# Patient Record
Sex: Male | Born: 1985 | Race: White | Hispanic: No | Marital: Married | Smoking: Never smoker
Health system: Southern US, Community
[De-identification: ages and names within clinical notes are randomized; demographics above are authoritative.]

## PROBLEM LIST (undated history)

## (undated) DIAGNOSIS — F988 Other specified behavioral and emotional disorders with onset usually occurring in childhood and adolescence: Secondary | ICD-10-CM

## (undated) DIAGNOSIS — R569 Unspecified convulsions: Secondary | ICD-10-CM

## (undated) DIAGNOSIS — J189 Pneumonia, unspecified organism: Secondary | ICD-10-CM

## (undated) DIAGNOSIS — I2699 Other pulmonary embolism without acute cor pulmonale: Secondary | ICD-10-CM

## (undated) DIAGNOSIS — J939 Pneumothorax, unspecified: Secondary | ICD-10-CM

## (undated) HISTORY — DX: Other pulmonary embolism without acute cor pulmonale: I26.99

## (undated) HISTORY — DX: Unspecified convulsions: R56.9

## (undated) HISTORY — PX: NO PAST SURGERIES: SHX2092

## (undated) HISTORY — DX: Other specified behavioral and emotional disorders with onset usually occurring in childhood and adolescence: F98.8

---
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
## 2005-01-14 ENCOUNTER — Ambulatory Visit: Payer: Self-pay | Admitting: Pediatrics

## 2005-02-03 ENCOUNTER — Encounter: Payer: Self-pay | Admitting: Family Medicine

## 2006-06-16 ENCOUNTER — Emergency Department (HOSPITAL_COMMUNITY): Admission: EM | Admit: 2006-06-16 | Discharge: 2006-06-16 | Payer: Self-pay | Admitting: Family Medicine

## 2010-02-19 ENCOUNTER — Ambulatory Visit: Payer: Self-pay | Admitting: Family Medicine

## 2010-02-20 LAB — CONVERTED CEMR LAB
Chlamydia, Swab/Urine, PCR: NEGATIVE
HIV: NONREACTIVE

## 2010-05-14 NOTE — Assessment & Plan Note (Signed)
Summary: NEW PT TO [facility]   Vital Signs:  Patient profile:   25 year old male Height:      68.25 inches Weight:      176.25 pounds BMI:     26.70 Temp:     98.5 degrees F oral Pulse rate:   84 / minute Pulse rhythm:   regular BP sitting:   102 / 80  (left arm) Cuff size:   regular  Vitals Entered By: Delilah Shan CMA Duncan Dull) (February 19, 2010 9:48 AM) CC: New Patient to Establish   History of Present Illness: New patient.  Asking about STD testing.  No symptoms.  No h/o stds.  H/o unprotected sex in past, not recently.  d/w patient about protection.  Not in relationship currently.   Preventive Screening-Counseling & Management  Alcohol-Tobacco     Smoking Status: never  Caffeine-Diet-Exercise     Does Patient Exercise: no      Drug Use:  no.    Allergies (verified): No Known Drug Allergies  Past History:  Family History: Last updated: 02/19/2010 Family History of Alcoholism/Addiction, Dad Family History Diabetes 1st degree relative, father, grandmother Family History Hypertension, uncle Family History Lung cancer, grandfather Family History of Cardiovascular disorder, grandmother Family History of Emphysema, father, grandmother Family History of Emotional/Mental Illness, sister (bipolar) M alive, healthy F alive, no contat with patient.  H/o alcoholism, DM2, smoker  Social History: Last updated: 02/19/2010 Occupation: Clinical biochemist Rep, works from home Education:  12, [employer] Single Living at home.  Never Smoked Alcohol use-yes, rare Drug use-no Regular exercise-no Enjoys video games, movies  Past Medical History: SZ at age 23, none since then.  Single episode.   CHICKENPOX, HX OF (ICD-V15.9) H/o oral HSV, rare symptoms  Family History: Family History of Alcoholism/Addiction, Dad Family History Diabetes 1st degree relative, father, grandmother Family History Hypertension, uncle Family History Lung cancer, grandfather Family History of  Cardiovascular disorder, grandmother Family History of Emphysema, father, grandmother Family History of Emotional/Mental Illness, sister (bipolar) M alive, healthy F alive, no contat with patient.  H/o alcoholism, DM2, smoker  Social History: Occupation: Clinical biochemist Rep, works from home Education:  12, [employer] Single Living at home.  Never Smoked Alcohol use-yes, rare Drug use-no Regular exercise-no Enjoys video games, moviesOccupation:  employed Smoking Status:  never Drug Use:  no Does Patient Exercise:  no  Review of Systems       See HPI.  Otherwise negative.    Physical Exam  General:  GEN: nad, alert and oriented HEENT: mucous membranes moist, TM wnl, nasal exam wnl NECK: supple w/o LA CV: rrr.  no murmur PULM: ctab, no inc wob ABD: soft, +bs EXT: no edema SKIN: no acute rash  Genitalia:  Testes bilaterally descended without nodularity, tenderness or masses. No scrotal masses or lesions. No penis lesions or urethral discharge.   Impression & Recommendations:  Problem # 1:  CONTACT/EXPOSURE TO OTHER COMMUNICABLE DISEASES (ICD-V01.89) d/w patient about protection.  Will notify patient at [phone] per his request, cell phone.  follow up as needed.  vaccinate per guidelines.  Prev [facility] at [facility], will request records.  Orders: T-GC Probe, urine [phone]) T-Chlamydia  Probe, urine [phone]) T-HIV Antibody  (Reflex) [phone]) T-RPR (Syphilis) [phone])  Other Orders: Admin 1st Vaccine (40102) Flu Vaccine 61yrs + (72536) Tdap => 63yrs IM (64403) Admin of Any Addtl Vaccine (47425)  Patient Instructions: 1)  We'll contact you with your lab report.  Take care.  Let us know if you  have other concerns.  Glad to see you today.    Orders Added: 1)  New Patient Level II [99202] 2)  T-GC Probe, urine [phone] 3)  T-Chlamydia  Probe, urine [phone] 4)  T-HIV Antibody  (Reflex) [[id number]] 5)  T-RPR (Syphilis) [[id number]] 6)   Admin 1st Vaccine [90471] 7)  Flu Vaccine 40yrs + [28413] 8)  Tdap => 40yrs IM [90715] 9)  Admin of Any Addtl Vaccine [24401]   Immunizations Administered:  Tetanus Vaccine:    Vaccine Type: Tdap    Site: left deltoid    Mfr: GlaxoSmithKline    Dose: 0.5 ml    Route: IM    Given by: Delilah Shan CMA (AAMA)    Exp. Date: 01/30/2012    Lot #: [id number]    VIS given: 02/28/08 version given February 19, 2010.   Immunizations Administered:  Tetanus Vaccine:    Vaccine Type: Tdap    Site: left deltoid    Mfr: GlaxoSmithKline    Dose: 0.5 ml    Route: IM    Given by: Delilah Shan CMA (AAMA)    Exp. Date: 01/30/2012    Lot #: [id number]    VIS given: 02/28/08 version given February 19, 2010.  Prior Medications (reviewed today): None Current Allergies (reviewed today): No known allergies Flu Vaccine Consent Questions     Do you have a history of severe allergic reactions to this vaccine? no    Any prior history of allergic reactions to egg and/or gelatin? no    Do you have a sensitivity to the preservative Thimersol? no    Do you have a past history of Guillan-Barre Syndrome? no    Do you currently have an acute febrile illness? no    Have you ever had a severe reaction to latex? no    Vaccine information given and explained to patient? yes    Are you currently pregnant? no    Lot Number:[id number]   Exp Date:10/10/2010   Site Given  Left Deltoid IMies Lugene Fuquay CMA (AAMA)  February 19, 2010 10:34 AM            .lbflu  Appended Document: NEW PT TO [facility]

## 2010-05-14 NOTE — Letter (Signed)
Summary: Lajean Manes MD  Lajean Manes MD   Imported By: Lester Glencoe 03/19/2010 11:12:56  _____________________________________________________________________  External Attachment:    Type:   Image     Comment:   External Document

## 2014-05-31 ENCOUNTER — Ambulatory Visit (INDEPENDENT_AMBULATORY_CARE_PROVIDER_SITE_OTHER): Payer: 59 | Admitting: Internal Medicine

## 2014-05-31 ENCOUNTER — Encounter: Payer: Self-pay | Admitting: Internal Medicine

## 2014-05-31 VITALS — BP 127/80 | HR 113 | Temp 98.1°F | Resp 18 | Wt 228.8 lb

## 2014-05-31 DIAGNOSIS — F419 Anxiety disorder, unspecified: Secondary | ICD-10-CM

## 2014-05-31 DIAGNOSIS — F988 Other specified behavioral and emotional disorders with onset usually occurring in childhood and adolescence: Secondary | ICD-10-CM | POA: Insufficient documentation

## 2014-05-31 DIAGNOSIS — R197 Diarrhea, unspecified: Secondary | ICD-10-CM

## 2014-05-31 DIAGNOSIS — Z114 Encounter for screening for human immunodeficiency virus [HIV]: Secondary | ICD-10-CM

## 2014-05-31 DIAGNOSIS — R350 Frequency of micturition: Secondary | ICD-10-CM

## 2014-05-31 LAB — COMPREHENSIVE METABOLIC PANEL
ALBUMIN: 4.2 g/dL (ref 3.5–5.2)
ALK PHOS: 83 U/L (ref 39–117)
ALT: 27 U/L (ref 0–53)
AST: 21 U/L (ref 0–37)
BILIRUBIN TOTAL: 0.4 mg/dL (ref 0.2–1.2)
BUN: 20 mg/dL (ref 6–23)
CO2: 24 mEq/L (ref 19–32)
Calcium: 9.2 mg/dL (ref 8.4–10.5)
Chloride: 106 mEq/L (ref 96–112)
Creat: 0.93 mg/dL (ref 0.50–1.35)
Glucose, Bld: 71 mg/dL (ref 70–99)
POTASSIUM: 4.6 meq/L (ref 3.5–5.3)
SODIUM: 140 meq/L (ref 135–145)
TOTAL PROTEIN: 7.1 g/dL (ref 6.0–8.3)

## 2014-05-31 NOTE — Progress Notes (Signed)
Subjective:    Patient ID: Billy Galloway, male    DOB: 07/14/1985, 29 y.o.   MRN: [id number]  DOS:  05/31/2014 Type of visit - description : New patient, several concerns  Interval history: Likes a  screening for HIV and diabetes Also, few months history of diarrhea, states his stools are loose but not watery, he has several BMs daily (previously had one usually). No change in his diet. No blood in the stools, no nausea or vomiting.  He is also urinating more than usual but denies excessive thirst or increase in appetite. Has noticed some urinary frequency but no blood in the urine or difficulty urinating. No weight loss.  Also, for long time he knew he has introverted personality, he usually gets anxious when he is in crowded places or in front of people, symptoms are slightly getting worse.    Review of Systems See history of present illness Also denies fever or chills. No chest pain or lower extremity edema No cough, shortness of breath or sputum production No dizziness, syncope, headaches. No runny nose, sore throat, eye discharge or redness  Past Medical History  Diagnosis Date  . Chicken pox   . Seizures     as a baby  . ADD (attention deficit disorder)     Past Surgical History  Procedure Laterality Date  . No past surgeries      History   Social History  . Marital Status: Single    Spouse Name: N/A  . Number of Children: 0  . Years of Education: N/A   Occupational History  . some college, works for [employer] (from home)    Social History Main Topics  . Smoking status: Never Smoker   . Smokeless tobacco: Never Used  . Alcohol Use: No     Comment: rare   . Drug Use: No  . Sexual Activity: Not on file   Other Topics Concern  . Not on file   Social History Narrative   Lives by himself     Family History  Problem Relation Age of Onset  . Alcohol abuse Father   . Hypertension Father   . Diabetes Father   . Alcohol abuse Paternal Grandfather     . Hypertension Paternal Grandfather   . Diabetes Paternal Grandfather   . CAD Other     GM?  Marland Kitchen. Colon cancer Neg Hx   . Prostate cancer Neg Hx       Medication List       This list is accurate as of: 05/31/14 11:59 PM.  Always use your most recent med list.               amphetamine-dextroamphetamine 20 MG tablet  Commonly known as:  ADDERALL  Take 1 tablet by mouth daily.           Objective:   Physical Exam BP 127/80 mmHg  Pulse 113  Temp(Src) 98.1 F (36.7 C) (Oral)  Resp 18  Wt 228 lb 12.8 oz (103.783 kg)  SpO2 97%  General:   Well developed, well nourished . NAD.  Neck:  Full range of motion. Supple. No  Thyromegaly . HEENT:  Normocephalic . Face symmetric, atraumatic Lungs:  CTA B Normal respiratory effort, no intercostal retractions, no accessory muscle use. Heart: Regular heart rate, slightly tachycardic,  no murmur.  Abdomen:  Not distended, soft, non-tender. No rebound or rigidity. No mass,organomegaly Muscle skeletal: no pretibial edema bilaterally  Skin: Exposed areas without rash. Not pale.  Not jaundice Neurologic:  alert & oriented X3.  Speech normal, gait appropriate for age and unassisted Strength symmetric and appropriate for age. Minimal finger tremor when he extends his hands ?  Psych: Cognition and judgment appear intact.  Cooperative with normal attention span and concentration.  Behavior appropriate. No anxious or depressed appearing.        Assessment & Plan:    Screening for HIV Screening for diabetes   Urinary frequency, we are checking his blood sugar, will also check a UA

## 2014-05-31 NOTE — Progress Notes (Signed)
Pre visit review using our clinic review tool, if applicable. No additional management support is needed unless otherwise documented below in the visit note. 

## 2014-05-31 NOTE — Patient Instructions (Signed)
Get your blood work before you leave   Start taking a probiotic such as Align 1 daily for one month. If the diarrhea is not gradually improving over the next 3 weeks , please let me know.  Come back in 3 months  for a   physical exam   Come back fasting

## 2014-06-01 LAB — URINALYSIS, ROUTINE W REFLEX MICROSCOPIC
Bilirubin Urine: NEGATIVE
Glucose, UA: NEGATIVE mg/dL
HGB URINE DIPSTICK: NEGATIVE
LEUKOCYTES UA: NEGATIVE
NITRITE: NEGATIVE
PROTEIN: NEGATIVE mg/dL
Specific Gravity, Urine: >1.03 — ABNORMAL HIGH (ref 1.005–1.030)
UROBILINOGEN UA: 0.2 mg/dL (ref 0.0–1.0)
pH: 6.5 (ref 5.0–8.0)

## 2014-06-01 LAB — CBC WITH DIFFERENTIAL/PLATELET
BASOS PCT: 1 % (ref 0–1)
Basophils Absolute: 0.1 10*3/uL (ref 0.0–0.1)
EOS ABS: 0.1 10*3/uL (ref 0.0–0.7)
Eosinophils Relative: 1 % (ref 0–5)
HCT: 46 % (ref 39.0–52.0)
HEMOGLOBIN: 15.9 g/dL (ref 13.0–17.0)
LYMPHS ABS: 1.9 10*3/uL (ref 0.7–4.0)
Lymphocytes Relative: 16 % (ref 12–46)
MCH: 29.4 pg (ref 26.0–34.0)
MCHC: 34.6 g/dL (ref 30.0–36.0)
MCV: 85.2 fL (ref 78.0–100.0)
MPV: 10.2 fL (ref 8.6–12.4)
Monocytes Absolute: 1.5 10*3/uL — ABNORMAL HIGH (ref 0.1–1.0)
Monocytes Relative: 13 % — ABNORMAL HIGH (ref 3–12)
NEUTROS ABS: 8 10*3/uL — AB (ref 1.7–7.7)
NEUTROS PCT: 69 % (ref 43–77)
Platelets: 341 10*3/uL (ref 150–400)
RBC: 5.4 MIL/uL (ref 4.22–5.81)
RDW: 13.7 % (ref 11.5–15.5)
WBC: 11.6 10*3/uL — AB (ref 4.0–10.5)

## 2014-06-01 LAB — HIV ANTIBODY (ROUTINE TESTING W REFLEX): HIV 1&2 Ab, 4th Generation: NONREACTIVE

## 2014-06-01 LAB — TSH: TSH: 1.023 u[IU]/mL (ref 0.350–4.500)

## 2014-06-02 NOTE — Assessment & Plan Note (Signed)
Diarrhea, Loose stools and multiple BMs for few months. Not much evidence of a infectious process. He is a slightly tachycardic and has mild tremor, thyroid disease? Plan:  Trial of probiotics, CBC, CMP and TSH. If not better he will let me know.

## 2014-06-02 NOTE — Assessment & Plan Note (Signed)
Sx c/w social anxiety per description, he is a established patients with psychiatry Dr. Evelene CroonKaur, recommend to discuss with them

## 2014-08-13 ENCOUNTER — Telehealth: Payer: Self-pay | Admitting: Internal Medicine

## 2014-08-13 NOTE — Telephone Encounter (Signed)
Pre visit letter sent  °

## 2014-09-02 ENCOUNTER — Telehealth: Payer: Self-pay | Admitting: *Deleted

## 2014-09-02 NOTE — Telephone Encounter (Signed)
Unable to reach patient at time of Pre-Visit Call.  Left message for patient to return call when available.    

## 2014-09-03 ENCOUNTER — Encounter: Payer: Self-pay | Admitting: Internal Medicine

## 2014-09-03 ENCOUNTER — Ambulatory Visit (INDEPENDENT_AMBULATORY_CARE_PROVIDER_SITE_OTHER): Payer: 59 | Admitting: Internal Medicine

## 2014-09-03 ENCOUNTER — Other Ambulatory Visit: Payer: Self-pay

## 2014-09-03 VITALS — BP 126/72 | HR 109 | Temp 98.6°F | Ht 69.0 in | Wt 234.2 lb

## 2014-09-03 DIAGNOSIS — Z0000 Encounter for general adult medical examination without abnormal findings: Secondary | ICD-10-CM | POA: Insufficient documentation

## 2014-09-03 DIAGNOSIS — R197 Diarrhea, unspecified: Secondary | ICD-10-CM

## 2014-09-03 NOTE — Progress Notes (Signed)
Subjective:    Patient ID: Billy Galloway, male    DOB: 1985-06-07, 29 y.o.   MRN: [id number]  DOS:  09/03/2014 Type of visit - description : cpx Interval history: No new concerns, feeling well   Review of Systems Constitutional: No fever. No chills. No unexplained wt changes. No unusual sweats  HEENT: No dental problems, no ear discharge, no facial swelling, no voice changes. No eye discharge, no eye  redness , no  intolerance to light   Respiratory: No wheezing , no  difficulty breathing. No cough , no mucus production  Cardiovascular: No CP, no leg swelling , no  Palpitations  GI: no nausea, no vomiting,   no  abdominal pain. Continue with diarrhea, approximately 2 or 3 loose bowel movements daily. No blood in the stools. No dysphagia, no odynophagia    Endocrine: No polyphagia, no polyuria , no polydipsia  GU: No dysuria, gross hematuria, difficulty urinating. No urinary urgency, continue with urinary frequency without other urinary sx  Musculoskeletal: No joint swellings or unusual aches or pains  Skin: No change in the color of the skin, palor , no  Rash  Allergic, immunologic: No environmental allergies , no  food allergies  Neurological: No dizziness no  syncope. No headaches. No diplopia, no slurred, no slurred speech, no motor deficits, no facial  Numbness  Hematological: No enlarged lymph nodes, no easy bruising , no unusual bleedings  Psychiatry: No suicidal ideas, no hallucinations, no beavior problems, no confusion.  Anxiety, on Xanax per Dr. Evelene Croon, no depression    Past Medical History  Diagnosis Date  . Chicken pox   . Seizures     as a baby  . ADD (attention deficit disorder)     Past Surgical History  Procedure Laterality Date  . No past surgeries      History   Social History  . Marital Status: Single    Spouse Name: N/A  . Number of Children: 0  . Years of Education: N/A   Occupational History  . some college, works for [employer] (from  home)    Social History Main Topics  . Smoking status: Never Smoker   . Smokeless tobacco: Never Used  . Alcohol Use: No     Comment: rare   . Drug Use: No  . Sexual Activity: Not on file   Other Topics Concern  . Not on file   Social History Narrative   Lives by himself     Family History  Problem Relation Age of Onset  . Alcohol abuse Father   . Hypertension Father   . Diabetes Father   . Alcohol abuse Paternal Grandfather   . Hypertension Paternal Grandfather   . Diabetes Paternal Grandfather   . CAD Other     GM?  Marland Kitchen Colon cancer Neg Hx   . Prostate cancer Neg Hx       Medication List       This list is accurate as of: 09/03/14 11:59 PM.  Always use your most recent med list.               ALPRAZolam 0.5 MG tablet  Commonly known as:  XANAX  Take 1 tablet by mouth daily as needed.     amphetamine-dextroamphetamine 20 MG tablet  Commonly known as:  ADDERALL  Take 1 tablet by mouth daily.           Objective:   Physical Exam BP 126/72 mmHg  Pulse 109  Temp(Src)  98.6 F (37 C) (Oral)  Ht 5\' 9"  (1.753 m)  Wt 234 lb 4 oz (106.255 kg)  BMI 34.58 kg/m2  SpO2 97% General:   Well developed, well nourished . NAD.  HEENT:  Normocephalic . Face symmetric, atraumatic Neck: No thyromegaly Lungs:  CTA B Normal respiratory effort, no intercostal retractions, no accessory muscle use. Heart: RRR,  no murmur.  no pretibial edema bilaterally  Abdomen:  Not distended, soft, non-tender. No rebound or rigidity. No mass,organomegaly Skin: Not pale. Not jaundice Neurologic:  alert & oriented X3.  Speech normal, gait appropriate for age and unassisted Psych--  Cognition and judgment appear intact.  Cooperative with normal attention span and concentration.  Behavior appropriate. slt  anxious but no depressed appearing.       Assessment & Plan:

## 2014-09-03 NOTE — Progress Notes (Signed)
Pre visit review using our clinic review tool, if applicable. No additional management support is needed unless otherwise documented below in the visit note. 

## 2014-09-03 NOTE — Assessment & Plan Note (Signed)
Td 2011 Extensive discussion about diet, exercise, the need to lose weight. Recent labs reviewed, will check a cholesterol panel and CBC, last WBC was slightly elevated.  Other issues: Diarrhea, loose stools: This is going on for years, recent labs negative. We agreed to refer to GI. Further eval? IBS? Cscope? Mild tachycardia, recommend to discuss with psychiatry, related to Adderall? Mild urinary frequency for few months, no other urinary symptoms, recent UA negative. Recommend observation.

## 2014-09-03 NOTE — Patient Instructions (Signed)
Please go to the lab

## 2014-09-04 LAB — CBC WITH DIFFERENTIAL/PLATELET
BASOS ABS: 0.1 10*3/uL (ref 0.0–0.1)
Basophils Relative: 1.4 % (ref 0.0–3.0)
Eosinophils Absolute: 0.2 10*3/uL (ref 0.0–0.7)
Eosinophils Relative: 2.5 % (ref 0.0–5.0)
HEMATOCRIT: 44.5 % (ref 39.0–52.0)
Hemoglobin: 15.4 g/dL (ref 13.0–17.0)
LYMPHS ABS: 2 10*3/uL (ref 0.7–4.0)
Lymphocytes Relative: 21 % (ref 12.0–46.0)
MCHC: 34.6 g/dL (ref 30.0–36.0)
MCV: 84.5 fl (ref 78.0–100.0)
Monocytes Absolute: 1 10*3/uL (ref 0.1–1.0)
Monocytes Relative: 10.1 % (ref 3.0–12.0)
NEUTROS ABS: 6.3 10*3/uL (ref 1.4–7.7)
NEUTROS PCT: 65 % (ref 43.0–77.0)
PLATELETS: 281 10*3/uL (ref 150.0–400.0)
RBC: 5.26 Mil/uL (ref 4.22–5.81)
RDW: 13.6 % (ref 11.5–15.5)
WBC: 9.7 10*3/uL (ref 4.0–10.5)

## 2014-09-04 LAB — LIPID PANEL
CHOL/HDL RATIO: 3
Cholesterol: 162 mg/dL (ref 0–200)
HDL: 47.7 mg/dL (ref >39.00)
NONHDL: 114.3
Triglycerides: 252 mg/dL — ABNORMAL HIGH (ref 0.0–149.0)
VLDL: 50.4 mg/dL — ABNORMAL HIGH (ref 0.0–40.0)

## 2014-09-04 LAB — LDL CHOLESTEROL, DIRECT: Direct LDL: 96 mg/dL

## 2014-09-06 ENCOUNTER — Encounter: Payer: Self-pay | Admitting: Gastroenterology

## 2014-09-06 ENCOUNTER — Ambulatory Visit (INDEPENDENT_AMBULATORY_CARE_PROVIDER_SITE_OTHER): Payer: 59 | Admitting: Gastroenterology

## 2014-09-06 ENCOUNTER — Other Ambulatory Visit (INDEPENDENT_AMBULATORY_CARE_PROVIDER_SITE_OTHER): Payer: 59

## 2014-09-06 VITALS — BP 116/78 | HR 80 | Ht 68.25 in | Wt 233.2 lb

## 2014-09-06 DIAGNOSIS — R197 Diarrhea, unspecified: Secondary | ICD-10-CM

## 2014-09-06 LAB — SEDIMENTATION RATE: Sed Rate: 12 mm/hr (ref 0–22)

## 2014-09-06 NOTE — Progress Notes (Signed)
HPI: This is a  very pleasant 29 year old man    who was referred to me by Wanda Plump, MD  to evaluate  chronic loose stools .    Chief complaint is chronic diarrhea  Loose stools, diarrhea for 9-12 months.  Will go at least 2-3 times.+ nocturnal occasional.  Never bloody.  Overall weight up recently.  No abd cramping.  + pyrosis at times.  No med changes recently.  Drinks 2-3 sodas daily.  Rarely etoh only.  No abx that he can recall.  No diarrhea, colitis in family.   Labs 06/2014; HIV neg, TSH normal, CBC essentially normal, CMET normal.  Review of systems: Pertinent positive and negative review of systems were noted in the above HPI section. Complete review of systems was performed and was otherwise normal.   Past Medical History  Diagnosis Date  . Chicken pox   . Seizures     as a baby  . ADD (attention deficit disorder)     Past Surgical History  Procedure Laterality Date  . No past surgeries      Current Outpatient Prescriptions  Medication Sig Dispense Refill  . ALPRAZolam (XANAX) 0.5 MG tablet Take 1 tablet by mouth daily as needed.    Marland Kitchen amphetamine-dextroamphetamine (ADDERALL) 20 MG tablet Take 1 tablet by mouth daily.  0   No current facility-administered medications for this visit.    Allergies as of 09/06/2014  . (No Known Allergies)    Family History  Problem Relation Age of Onset  . Alcohol abuse Father   . Hypertension Father   . Diabetes Father   . Alcohol abuse Paternal Grandfather   . Hypertension Paternal Grandfather   . Diabetes Paternal Grandfather   . CAD Other     GM?  Marland Kitchen Colon cancer Neg Hx   . Prostate cancer Neg Hx   . Colon polyps Neg Hx   . Esophageal cancer Neg Hx   . Kidney disease Neg Hx   . Gallbladder disease Neg Hx     History   Social History  . Marital Status: Single    Spouse Name: N/A  . Number of Children: 0  . Years of Education: N/A   Occupational History  . some college, works for [employer] (from  home)    Social History Main Topics  . Smoking status: Never Smoker   . Smokeless tobacco: Never Used  . Alcohol Use: 0.0 oz/week    0 Standard drinks or equivalent per week     Comment: rare   . Drug Use: No  . Sexual Activity: Not on file   Other Topics Concern  . Not on file   Social History Narrative   Lives by himself     Physical Exam: Ht 5' 8.25" (1.734 m)  Wt 233 lb 4 oz (105.802 kg)  BMI 35.19 kg/m2 Constitutional: generally well-appearing Psychiatric: alert and oriented x3 Eyes: extraocular movements intact Mouth: oral pharynx moist, no lesions Neck: supple no lymphadenopathy Cardiovascular: heart regular rate and rhythm Lungs: clear to auscultation bilaterally Abdomen: soft, nontender, nondistended, no obvious ascites, no peritoneal signs, normal bowel sounds Extremities: no lower extremity edema bilaterally Skin: no lesions on visible extremities   Assessment and plan: 29 y.o. male with  chronic nonbloody diarrhea  This would be a bit unusual for infection. He may have underlying inflammatory bowel disease such as mild colitis area I recommended he have a battery of stool and blood tests, see those listed below. He will start  a single Imodium once daily. If the lab tests failed to show clear etiology and we will likely proceed with colonoscopy at his soonest convenience.   Rob Buntinganiel Jacobs, MD [facility] 09/06/2014, 10:57 AM  Cc: Wanda PlumpPaz, Jose E, MD

## 2014-09-06 NOTE — Patient Instructions (Signed)
You will have labs checked today in the basement lab.  Please head down after you check out with the front desk  (sed rate, celiac panel, stool pathogen panel). Start one imodium every morning after waking. If the labs above are not helpful, you will likely need a colonoscopy.

## 2014-09-10 ENCOUNTER — Other Ambulatory Visit: Payer: 59

## 2014-09-10 DIAGNOSIS — R197 Diarrhea, unspecified: Secondary | ICD-10-CM

## 2014-09-10 LAB — CELIAC PANEL 10
Endomysial Screen: NEGATIVE
Gliadin IgA: 17 Units (ref <20)
Gliadin IgG: 2 Units (ref <20)
IGA: 318 mg/dL (ref 68–379)
TISSUE TRANSGLUT AB: 1 U/mL (ref <6)
Tissue Transglutaminase Ab, IgA: 1 U/mL (ref <4)

## 2014-09-11 LAB — GASTROINTESTINAL PATHOGEN PANEL PCR
C. DIFFICILE TOX A/B, PCR: NEGATIVE
CRYPTOSPORIDIUM, PCR: NEGATIVE
Campylobacter, PCR: NEGATIVE
E COLI (ETEC) LT/ST, PCR: NEGATIVE
E COLI (STEC) STX1/STX2, PCR: NEGATIVE
E COLI 0157, PCR: NEGATIVE
GIARDIA LAMBLIA, PCR: NEGATIVE
Norovirus, PCR: NEGATIVE
ROTAVIRUS, PCR: NEGATIVE
Salmonella, PCR: NEGATIVE
Shigella, PCR: NEGATIVE

## 2015-07-03 ENCOUNTER — Telehealth: Payer: Self-pay | Admitting: Internal Medicine

## 2015-09-04 ENCOUNTER — Encounter: Payer: 59 | Admitting: Internal Medicine

## 2015-10-28 NOTE — Telephone Encounter (Signed)
Completed.

## 2017-03-10 ENCOUNTER — Encounter (HOSPITAL_COMMUNITY): Payer: Self-pay | Admitting: Emergency Medicine

## 2017-03-10 ENCOUNTER — Ambulatory Visit (HOSPITAL_COMMUNITY)
Admission: EM | Admit: 2017-03-10 | Discharge: 2017-03-10 | Disposition: A | Discharge status: Home / Self Care | Payer: Self-pay

## 2017-03-10 ENCOUNTER — Other Ambulatory Visit: Payer: Self-pay

## 2017-03-10 DIAGNOSIS — Z7901 Long term (current) use of anticoagulants: Secondary | ICD-10-CM | POA: Insufficient documentation

## 2017-03-10 DIAGNOSIS — Z8371 Family history of colonic polyps: Secondary | ICD-10-CM | POA: Insufficient documentation

## 2017-03-10 DIAGNOSIS — F419 Anxiety disorder, unspecified: Secondary | ICD-10-CM | POA: Insufficient documentation

## 2017-03-10 DIAGNOSIS — I2699 Other pulmonary embolism without acute cor pulmonale: Secondary | ICD-10-CM | POA: Insufficient documentation

## 2017-03-10 DIAGNOSIS — Z8249 Family history of ischemic heart disease and other diseases of the circulatory system: Secondary | ICD-10-CM | POA: Insufficient documentation

## 2017-03-10 DIAGNOSIS — Z79899 Other long term (current) drug therapy: Secondary | ICD-10-CM | POA: Insufficient documentation

## 2017-03-10 HISTORY — DX: Pneumonia, unspecified organism: J18.9

## 2017-03-10 LAB — PROTIME-INR
INR: 3.89
Prothrombin Time: 37.8 seconds — ABNORMAL HIGH (ref 11.4–15.2)

## 2017-03-10 MED ORDER — WARFARIN SODIUM 5 MG PO TABS: 7.5000 mg | ORAL_TABLET | Freq: Every day | ORAL | 0 refills | Status: DC

## 2017-03-10 NOTE — ED Triage Notes (Signed)
Pt states in [place], he was in the hospital for pneumonia and PEs, and given coumadin 7.5mg , so on the 11/26, he had his lab work done and INR was 8, PCP told him not to take it for 3 days, then have the level checked again and he is requesting a dosage adjusted based on his INR level.

## 2017-03-10 NOTE — Discharge Instructions (Signed)
Schedule primary care follow up and schedule follow up in the coumadin clinic.  Hold coumadin today.  Start 5mg  tablet tomorrow.

## 2017-03-10 NOTE — ED Provider Notes (Addendum)
[facility]    CSN: [id number] Arrival date & time: 03/10/17  1123     History   Chief Complaint Chief Complaint  Patient presents with  . Labs Only    HPI Billy Galloway is a 31 y.o. male.   [place].  He was diagnosed with a pulmonary embolus while living in [place] he was started on Coumadin and there at 7.5 mg a day.  He had his INR checked on 1126 and his INR was 8 he was told to hold his Coumadin for 3 days and have a recheck of his INR and have his Coumadin adjusted.  Patient denies any current complaints.  Patient does not have a local physician.  He reports his mother is attempting to help him establish a primary care doctor   The history is provided by the patient. No language interpreter was used.    Past Medical History:  Diagnosis Date  . ADD (attention deficit disorder)   . Chicken pox   . PE (pulmonary thromboembolism) (HCC)   . Pneumonia   . Seizures (HCC)    as a baby    Patient Active Problem List   Diagnosis Date Noted  . Annual physical exam 09/03/2014  . ADD (attention deficit disorder) 05/31/2014  . Diarrhea 05/31/2014  . Anxiety 05/31/2014  . CHICKENPOX, HX OF 02/19/2010    Past Surgical History:  Procedure Laterality Date  . NO PAST SURGERIES         Home Medications    Prior to Admission medications   Medication Sig Start Date End Date Taking? Authorizing Provider  warfarin (COUMADIN) 7.5 MG tablet Take 7.5 mg by mouth daily.   Yes [provider]  ALPRAZolam Prudy Feeler(XANAX) 0.5 MG tablet Take 1 tablet by mouth daily as needed. 06/06/14   [provider]  amphetamine-dextroamphetamine (ADDERALL) 20 MG tablet Take 1 tablet by mouth daily. 05/23/14   [provider]    Family History Family History  Problem Relation Age of Onset  . Alcohol abuse Father   . Hypertension Father   . Diabetes Father   . Alcohol abuse Paternal Grandfather   . Hypertension Paternal  Grandfather   . Diabetes Paternal Grandfather   . CAD Other        GM?  Marland Kitchen. Colon cancer Neg Hx   . Prostate cancer Neg Hx   . Colon polyps Neg Hx   . Esophageal cancer Neg Hx   . Kidney disease Neg Hx   . Gallbladder disease Neg Hx     Social History Social History   Tobacco Use  . Smoking status: Never Smoker  . Smokeless tobacco: Never Used  Substance Use Topics  . Alcohol use: Yes    Alcohol/week: 0.0 oz    Comment: rare   . Drug use: No     Allergies   Patient has no known allergies.   Review of Systems Review of Systems  All other systems reviewed and are negative.    Physical Exam Triage Vital Signs ED Triage Vitals  Enc Vitals Group     BP 03/10/17 1221 124/77     Pulse Rate 03/10/17 1221 93     Resp 03/10/17 1221 18     Temp 03/10/17 1221 98.6 F (37 C)     Temp src --      SpO2 03/10/17 1221 100 %     Weight --      Height --  Head Circumference --      Peak Flow --      Pain Score 03/10/17 1224 0     Pain Loc --      Pain Edu? --      Excl. in GC? --    No data found.  Updated Vital Signs BP 124/77   Pulse 93   Temp 98.6 F (37 C)   Resp 18   SpO2 100%   Visual Acuity Right Eye Distance:   Left Eye Distance:   Bilateral Distance:    Right Eye Near:   Left Eye Near:    Bilateral Near:     Physical Exam  Constitutional: He is oriented to person, place, and time. He appears well-developed and well-nourished.  HENT:  Head: Normocephalic and atraumatic.  Cardiovascular: Normal rate.  Pulmonary/Chest: Effort normal.  Abdominal: He exhibits no distension.  Musculoskeletal: Normal range of motion.  Neurological: He is alert and oriented to person, place, and time.  Psychiatric: He has a normal mood and affect.  Nursing note and vitals reviewed.    UC Treatments / Results  Labs (all labs ordered are listed, but only abnormal results are displayed) Labs Reviewed  PROTIME-INR    EKG  EKG Interpretation None        Radiology No results found.  Procedures Procedures (including critical care time)  Medications Ordered in UC Medications - No data to display   Initial Impression / Assessment and Plan / UC Course  I have reviewed the triage vital signs and the nursing notes.  Pertinent labs & imaging results that were available during my care of the patient were reviewed by me and considered in my medical decision making (see chart for details).     Pt's INr is 3.85. I will start pt on 5mg  a day.   I advised him to start tomorrow.   Pt referred to wellness and coumadin clinic.  I counseled on need for recheck.   Pt counseled on bleeding risk.   Final Clinical Impressions(s) / UC Diagnoses   Final diagnoses:  Other acute pulmonary embolism without acute [facility]ale [facility](HCC)    ED Discharge Orders    None     Meds ordered this encounter  Medications  . warfarin (COUMADIN) 5 MG tablet    Sig: Take 1.5 tablets (7.5 mg total) by mouth daily.    Dispense:  30 tablet    Refill:  0    Order Specific Question:   Supervising Provider    Answer:   Eustace MooreMURRAY, LAURA W [[id number]]  An After Visit Summary was printed and given to the patient.  Care Manger mailed to ask for assistance getting pt to coumadin clinic and primary  Controlled Substance Prescriptions [place] Controlled Substance Registry consulted? Not Applicable   Osie CheeksSofia, Pansy Ostrovsky K, PA-C 03/10/17 1336    Elson AreasSofia, Tymia Streb K, [place]-C 03/10/17 1340

## 2017-03-11 ENCOUNTER — Encounter: Payer: Self-pay | Admitting: Oncology

## 2017-03-11 ENCOUNTER — Telehealth: Payer: Self-pay | Admitting: Oncology

## 2017-03-11 ENCOUNTER — Telehealth: Payer: Self-pay

## 2017-03-11 NOTE — Telephone Encounter (Signed)
Message received from Eldridge AbrahamsAngela Kritzer, RN CM [facility] to [facility] care. An appointment has been scheduled for 03/14/17 [facility]Stacey [facility] and then on 03/16/17 with a provider for follow up .   Update provided to Breck CoonsA. Kritzer, RN CM

## 2017-03-11 NOTE — Telephone Encounter (Signed)
Appt has been scheduled, with the pt's mom, to see dr. Clelia CroftShadad on 12/13 at 11am. Aware that her son should arrive 30 minutes early. Instructed her to bring her son's records to the appt as well. Address verified. Letter mailed.

## 2017-03-14 ENCOUNTER — Ambulatory Visit: Payer: Self-pay | Attending: Internal Medicine | Admitting: Pharmacist

## 2017-03-14 DIAGNOSIS — I2699 Other pulmonary embolism without acute cor pulmonale: Secondary | ICD-10-CM

## 2017-03-14 LAB — POCT INR: INR: 6.1

## 2017-03-14 NOTE — Patient Instructions (Addendum)
No warfarin today. No warfarin tomorrow. We will plan to restart warfarin on Wednesday at 2.5 mg daily. Return Wednesday to see new primary care provider and we will get an INR then.  Discuss Xarelto or Eliquis with Bertram Denver and your hematologist  Warfarin tablets What is this medicine? WARFARIN (WAR far in) is an anticoagulant. It is used to treat or prevent clots in the veins, arteries, lungs, or heart. This medicine may be used for other purposes; ask your health care provider or pharmacist if you have questions. COMMON BRAND NAME(S): Coumadin, Jantoven What should I tell my health care provider before I take this medicine? They need to know if you have any of these conditions: -alcoholism -anemia -bleeding disorders -cancer -diabetes -heart disease -high blood pressure -history of bleeding in the gastrointestinal tract -history of stroke or other brain injury or disease -kidney or liver disease -protein C deficiency -protein S deficiency -psychosis or dementia -recent injury, recent or planned surgery or procedure -an unusual or allergic reaction to warfarin, other medicines, foods, dyes, or preservatives -pregnant or trying to get pregnant -breast-feeding How should I use this medicine? Take this medicine by mouth with a glass of water. Follow the directions on the prescription label. You can take this medicine with or without food. Take your medicine at the same time each day. Do not take it more often than directed. Do not stop taking except on your doctor's advice. Stopping this medicine may increase your risk of a blood clot. Be sure to refill your prescription before you run out of medicine. If your doctor or healthcare professional calls to change your dose, write down the dose and any other instructions. Always read the dose and instructions back to him or her to make sure you understand them. Tell your doctor or healthcare professional what strength of tablets you have  on hand. Ask how many tablets you should take to equal your new dose. Write the date on the new instructions and keep them near your medicine. If you are told to stop taking your medicine until your next blood test, call your doctor or healthcare professional if you do not hear anything within 24 hours of the test to find out your new dose or when to restart your prior dose. A special MedGuide will be given to you by the pharmacist with each prescription and refill. Be sure to read this information carefully each time. Talk to your pediatrician regarding the use of this medicine in children. Special care may be needed. Overdosage: If you think you have taken too much of this medicine contact a poison control center or emergency room at once. NOTE: This medicine is only for you. Do not share this medicine with others. What if I miss a dose? It is important not to miss a dose. If you miss a dose, call your healthcare provider. Take the dose as soon as possible on the same day. If it is almost time for your next dose, take only that dose. Do not take double or extra doses to make up for a missed dose. What may interact with this medicine? Do not take this medicine with any of the following medications: -agents that prevent or dissolve blood clots -aspirin or other salicylates -danshen -dextrothyroxine -mifepristone -St. John's Wort -red yeast rice This medicine may also interact with the following medications: -acetaminophen -agents that lower cholesterol -alcohol -allopurinol -amiodarone -antibiotics or medicines for treating bacterial, fungal or viral infections -azathioprine -barbiturate medicines for inducing sleep  or treating seizures -certain medicines for diabetes -certain medicines for heart rhythm problems -certain medicines for hepatitis C virus infections like daclatasvir, dasabuvir; ombitasvir; paritaprevir; ritonavir, elbasvir; grazoprevir, ledipasvir; sofosbuvir, simeprevir,  sofosbuvir, sofosbuvir; velpatasvir, sofosbuvir; velpatasvir; voxilaprevir -certain medicines for high blood pressure -chloral hydrate -cisapride -conivaptan -disulfiram -male hormones, including contraceptive or birth control pills -general anesthetics -herbal or dietary products like garlic, ginkgo, ginseng, green tea, or kava kava -influenza virus vaccine -male hormones -medicines for mental depression or psychosis -medicines for some types of cancer -medicines for stomach problems -methylphenidate -NSAIDs, medicines for pain and inflammation, like ibuprofen or naproxen -propoxyphene -quinidine, quinine -raloxifene -seizure or epilepsy medicine like carbamazepine, phenytoin, and valproic acid -steroids like cortisone and prednisone -tamoxifen -thyroid medicine -tramadol -vitamin c, vitamin e, and vitamin K -zafirlukast -zileuton This list may not describe all possible interactions. Give your health care provider a list of all the medicines, herbs, non-prescription drugs, or dietary supplements you use. Also tell them if you smoke, drink alcohol, or use illegal drugs. Some items may interact with your medicine. What should I watch for while using this medicine? Visit your doctor or health care professional for regular checks on your progress. You will need to have a blood test called a PT/INR regularly. The PT/INR blood test is done to make sure you are getting the right dose of this medicine. It is important to not miss your appointment for the blood tests. When you first start taking this medicine, these tests are done often. Once the correct dose is determined and you take your medicine properly, these tests can be done less often. Notify your doctor or health care professional and seek emergency treatment if you develop breathing problems; changes in vision; chest pain; severe, sudden headache; pain, swelling, warmth in the leg; trouble speaking; sudden numbness or weakness of  the face, arm or leg. These can be signs that your condition has gotten worse. While you are taking this medicine, carry an identification card with your name, the name and dose of medicine(s) being used, and the name and phone number of your doctor or health care professional or person to contact in an emergency. Do not start taking or stop taking any medicines or over-the-counter medicines except on the advice of your doctor or health care professional. You should discuss your diet with your doctor or health care professional. Do not make major changes in your diet. Vitamin K can affect how well this medicine works. Many foods contain vitamin K. It is important to eat a consistent amount of foods with vitamin K. Other foods with vitamin K that you should eat in consistent amounts are asparagus, basil, black eyed peas, broccoli, brussel sprouts, cabbage, green onions, green tea, parsley, green leafy vegetables like beet greens, collard greens, kale, spinach, turnip greens, or certain lettuces like green leaf or romaine. This medicine can cause birth defects or bleeding in an unborn child. Women of childbearing age should use effective birth control while taking this medicine. If a woman becomes pregnant while taking this medicine, she should discuss the potential risks and her options with her health care professional. Avoid sports and activities that might cause injury while you are using this medicine. Severe falls or injuries can cause unseen bleeding. Be careful when using sharp tools or knives. Consider using an Neurosurgeonelectric razor. Take special care brushing or flossing your teeth. Report any injuries, bruising, or red spots on the skin to your doctor or health care professional. If you have an  illness that causes vomiting, diarrhea, or fever for more than a few days, contact your doctor. Also check with your doctor if you are unable to eat for several days. These problems can change the effect of this  medicine. Even after you stop taking this medicine, it takes several days before your body recovers its normal ability to clot blood. Ask your doctor or health care professional how long you need to be careful. If you are going to have surgery or dental work, tell your doctor or health care professional that you have been taking this medicine. What side effects may I notice from receiving this medicine? Side effects that you should report to your doctor or health care professional as soon as possible: -allergic reactions like skin rash, itching or hives, swelling of the face, lips, or tongue -breathing problems -chest pain -dizziness -headache -heavy menstrual bleeding or vaginal bleeding -pain in the lower back or side -painful, blue or purple toes -painful skin ulcers that do not go away -signs and symptoms of bleeding such as bloody or black, tarry stools; red or dark-brown urine; spitting up blood or brown material that looks like coffee grounds; red spots on the skin; unusual bruising or bleeding from the eye, gums, or nose -stomach pain -unusually weak or tired Side effects that usually do not require medical attention (report to your doctor or health care professional if they continue or are bothersome): -diarrhea -hair loss This list may not describe all possible side effects. Call your doctor for medical advice about side effects. You may report side effects to FDA at 1-800-FDA-1088. Where should I keep my medicine? Keep out of the reach of children. Store at room temperature between 15 and 30 degrees C (59 and 86 degrees F). Protect from light. Throw away any unused medicine after the expiration date. Do not flush down the toilet. NOTE: This sheet is a summary. It may not cover all possible information. If you have questions about this medicine, talk to your doctor, pharmacist, or health care provider.  2018 Elsevier/Gold Standard (2016-03-18 11:27:41)   Vitamin K Foods and  Warfarin Warfarin is a blood thinner (anticoagulant). Anticoagulant medicines help prevent the formation of blood clots. These medicines work by decreasing the activity of vitamin K, which promotes normal blood clotting. When you take warfarin, problems can occur from suddenly increasing or decreasing the amount of vitamin K that you eat from one day to the next. Problems may include:  Blood clots.  Bleeding.  What general guidelines do I need to follow? To avoid problems when taking warfarin:  Eat a balanced diet that includes: ? Fresh fruits and vegetables. ? Whole grains. ? Low-fat dairy products. ? Lean proteins, such as fish, eggs, and lean cuts of meat.  Keep your intake of vitamin K consistent from day to day. To do this: ? Avoid eating large amounts of vitamin K one day and low amounts of vitamin K the next day. ? If you take a multivitamin that contains vitamin K, be sure to take it every day. ? Know which foods contain vitamin K. Use the lists below to understand serving sizes and the amount of vitamin K in one serving.  Avoid major changes in your diet. If you are going to change your diet, talk with your health care provider before making changes.  Work with a Dealernutrition specialist (dietitian) to develop a meal plan that works best for you.  High vitamin K foods Foods that are high in vitamin  K contain more than 100 mcg (micrograms) per serving. These include:  Broccoli (cooked) -  cup has 110 mcg.  Brussels sprouts (cooked) -  cup has 109 mcg.  Greens, beet (cooked) -  cup has 350 mcg.  Greens, collard (cooked) -  cup has 418 mcg.  Greens, turnip (cooked) -  cup has 265 mcg.  Green onions or scallions -  cup has 105 mcg.  Kale (fresh or frozen) -  cup has 531 mcg.  Parsley (raw) - 10 sprigs has 164 mcg.  Spinach (cooked) -  cup has 444 mcg.  Swiss chard (cooked) -  cup has 287 mcg.  Moderate vitamin K foods Foods that have a moderate amount of  vitamin K contain 25-100 mcg per serving. These include:  Asparagus (cooked) - 5 spears have 38 mcg.  Black-eyed peas (dried) -  cup has 32 mcg.  Cabbage (cooked) -  cup has 37 mcg.  Kiwi fruit - 1 medium has 31 mcg.  Lettuce - 1 cup has 57-63 mcg.  Okra (frozen) -  cup has 44 mcg.  Prunes (dried) - 5 prunes have 25 mcg.  Watercress (raw) - 1 cup has 85 mcg.  Low vitamin K foods Foods low in vitamin K contain less than 25 mcg per serving. These include:  Artichoke - 1 medium has 18 mcg.  Avocado - 1 oz. has 6 mcg.  Blueberries -  cup has 14 mcg.  Cabbage (raw) -  cup has 21 mcg.  Carrots (cooked) -  cup has 11 mcg.  Cauliflower (raw) -  cup has 11 mcg.  Cucumber with peel (raw) -  cup has 9 mcg.  Grapes -  cup has 12 mcg.  Mango - 1 medium has 9 mcg.  Nuts - 1 oz. has 15 mcg.  Pear - 1 medium has 8 mcg.  Peas (cooked) -  cup has 19 mcg.  Pickles - 1 spear has 14 mcg.  Pumpkin seeds - 1 oz. has 13 mcg.  Sauerkraut (canned) -  cup has 16 mcg.  Soybeans (cooked) -  cup has 16 mcg.  Tomato (raw) - 1 medium has 10 mcg.  Tomato sauce -  cup has 17 mcg.  Vitamin K-free foods If a food contain less than 5 mcg per serving, it is considered to have no vitamin K. These foods include:  Bread and cereal products.  Cheese.  Eggs.  Fish and shellfish.  Meat and poultry.  Milk and dairy products.  Sunflower seeds.  Actual amounts of vitamin K in foods may be different depending on processing. Talk with your dietitian about what foods you can eat and what foods you should avoid. This information is not intended to replace advice given to you by your health care provider. Make sure you discuss any questions you have with your health care provider. Document Released: 01/24/2009 Document Revised: 10/19/2015 Document Reviewed: 07/02/2015 Elsevier Interactive Patient Education  2017 Else[employer] Inc.   Bleeding Precautions When on Anticoagulant  Therapy WHAT IS ANTICOAGULANT THERAPY? Anticoagulant therapy is taking medicine to prevent or reduce blood clots. It is also called blood thinner therapy. Blood clots that form in your blood vessels can be dangerous. They can break loose and travel to your heart, lungs, or brain. This increases your risk of a heart attack or stroke. Anticoagulant therapy causes blood to clot more slowly. You may need anticoagulant therapy if you have:  A medical condition that increases the likelihood that blood clots will form.  A  heart defect or a problem with heart rhythm. It is also a common treatment after heart surgery, such as valve replacement. WHAT ARE COMMON TYPES OF ANTICOAGULANT THERAPY? Anticoagulant medicine can be injected or taken by mouth.If you need anticoagulant therapy quickly at the hospital, the medicine may be injected under your skin or given through an IV tube. Heparin is a common example of an anticoagulant that you may get at the hospital. Most anticoagulant therapy is in the form of pills that you take at home every day. These may include:  Aspirin. This common blood thinner works by preventing blood cells (platelets) from sticking together to form a clot. Aspirin is not as strong as anticoagulants that slow down the time that it takes for your body to form a clot.  Clopidogrel. This is a newer type of drug that affects platelets. It is stronger than aspirin.  Warfarin. This is the most common anticoagulant. It changes the way your body uses vitamin K, a vitamin that helps your blood to clot. The risk of bleeding is higher with warfarin than with aspirin. You will need frequent blood tests to make sure you are taking the safest amount.  New anticoagulants. Several new drugs have been approved. They are all taken by mouth. Studies show that these drugs work as well as warfarin. They do not require blood testing. They may cause less bleeding risk than warfarin. WHAT DO I NEED TO  REMEMBER WHEN TAKING ANTICOAGULANT THERAPY? Anticoagulant therapy decreases your risk of forming a blood clot, but it increases your risk of bleeding. Work closely with your health care provider to make sure you are taking your medicine safely. These tips can help:  Learn ways to reduce your risk of bleeding.  If you are taking warfarin: ? Have blood tests as ordered by your health care provider. ? Do not make any sudden changes to your diet. Vitamin K in your diet can make warfarin less effective. ? Do not get pregnant. This medicine may cause birth defects.  Take your medicine at the same time every day. If you forget to take your medicine, take it as soon as you remember. If you miss a whole day, do not double your dose of medicine. Take your normal dose and call your health care provider to check in.  Do not stop taking your medicine on your own.  Tell your health care provider before you start taking any new medicine, vitamin, or herbal product. Some of these could interfere with your therapy.  Tell all of your health care providers that you are on anticoagulant therapy.  Do not have surgery, medical procedures, or dental work until you tell your health care provider that you are on anticoagulant therapy. WHAT CAN AFFECT HOW ANTICOAGULANTS WORK? Certain foods, vitamins, medicines, supplements, and herbal medicines change the way that anticoagulant therapy works. They may increase or decrease the effects of your anticoagulant therapy. Either result can be dangerous for you.  Many over-the-counter medicines for pain, colds, or stomach problems interfere with anticoagulant therapy. Take these only as told by your health care provider.  Do not drink alcohol. It can interfere with your medicine and increase your risk of an injury that causes bleeding.  If you are taking warfarin, do not begin eating more foods that contain vitamin K. These include leafy green vegetables. Ask your health  care provider if you should avoid any foods. WHAT ARE SOME WAYS TO PREVENT BLEEDING? You can prevent bleeding by taking certain precautions:  Be extra careful when you use knives, scissors, or other sharp objects.  Use an electric razor instead of a blade.  Do not use toothpicks.  Use a soft toothbrush.  Wear shoes that have nonskid soles.  Use bath mats and handrails in your bathroom.  Wear gloves while you do yard work.  Wear a helmet when you ride a bike.  Wear your seat belt.  Prevent falls by removing loose rugs and extension cords from areas where you walk.  Do not play contact sports or participate in other activities that have a high risk of injury. WHEN SHOULD I CONTACT MY HEALTH CARE PROVIDER? Call your health care provider if:  You miss a dose of medicine: ? And you are not sure what to do. ? For more than one day.  You have: ? Menstrual bleeding that is heavier than normal. ? Blood in your urine. ? A bloody nose or bleeding gums. ? Easy bruising. ? Blood in your stool (feces) or have black and tarry stool. ? Side effects from your medicine.  You feel weak or dizzy.  You become pregnant. Seek immediate medical care if:  You have bleeding that will not stop.  You have sudden and severe headache or belly pain.  You vomit or you cough up bright red blood.  You have a severe blow to your head. WHAT ARE SOME QUESTIONS TO ASK MY HEALTH CARE PROVIDER?  What is the best anticoagulant therapy for my condition?  What side effects should I watch for?  When should I take my medicine? What should I do if I forget to take it?  Will I need to have regular blood tests?  Do I need to change my diet? Are there foods or drinks that I should avoid?  What activities are safe for me?  What should I do if I want to get pregnant? This information is not intended to replace advice given to you by your health care provider. Make sure you discuss any questions you  have with your health care provider. Document Released: 03/10/2015 Document Reviewed: 03/10/2015 Elsevier Interactive Patient Education  2017 [employer].

## 2017-03-14 NOTE — Progress Notes (Signed)
    Pharmacy Anticoagulation Clinic  Subjective: Patient presents today for INR monitoring. Anticoagulation indication is PE.   Current dose of warfarin: 5 mg daily. He used to be on 7.5 mg daily but this was held [place] [facility] and had an INR >8. He was told to hold it 3 days. He reported to the ED 03/10/17 and INR was 3.89. He was restarted on warfarin 5 mg daily. Per patient and his wife, his INR has always been supratherapeutic since discharge with the diagnosis of the PE (early November 2018)  They are not sure of what caused the PE. 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 Did not eat a healthy, balanced diet. They were told the clot was likely provoked and he would need 3-6 months of anticoagulation.   Adherence to warfarin: denies any missed doses except when instructed to hold it. Signs/symptoms of bleeding: denies Recent changes in diet: trying to eat better. Recent changes in medications: none Upcoming procedures that may impact anticoagulation: none   Objective: Today's INR = 6.1  Lab Results  Component Value Date   INR 3.89 03/10/2017     Assessment and Plan: Anticoagulation: Patient is Supratherapeutic based on patient's INR of 6.1 and patient's INR goal of 2-3. Will Stop current warfarin dose x 2 days, and plan to restart Wednesday at 2.5 mg daily. Patient will have INR at visit with PCP on Wednesday and dose can be adjusted at that time if needed. No s/sx of bleeding currently. Provided extensive education on warfarin, including how to take, how vitamin K containing foods impact INR, what INR is, and medications to avoid when taking an anticoagulant. Also reviewed s/sx of bleeding. All of the patient's and wife's questions were addressed.  We need prior medical records but would consider DOAC in this patient if appropriate. Has appointment with Hematology next  week.   Patient verbalized understanding and was provided with written instructions. Next INR check planned for Wednesday when he establishes with PCP. Patient given return precautions and information on when he should go to the ED or call 911 if bleeding or has s/sx of a clot.

## 2017-03-16 ENCOUNTER — Encounter: Payer: Self-pay | Admitting: Nurse Practitioner

## 2017-03-16 ENCOUNTER — Ambulatory Visit: Payer: Self-pay | Attending: Nurse Practitioner | Admitting: Nurse Practitioner

## 2017-03-16 ENCOUNTER — Ambulatory Visit (HOSPITAL_BASED_OUTPATIENT_CLINIC_OR_DEPARTMENT_OTHER): Payer: Self-pay | Admitting: Pharmacist

## 2017-03-16 VITALS — BP 123/87 | HR 95 | Temp 98.0°F | Ht 69.0 in | Wt 234.8 lb

## 2017-03-16 DIAGNOSIS — Z811 Family history of alcohol abuse and dependence: Secondary | ICD-10-CM | POA: Insufficient documentation

## 2017-03-16 DIAGNOSIS — I2699 Other pulmonary embolism without acute cor pulmonale: Secondary | ICD-10-CM

## 2017-03-16 DIAGNOSIS — Z833 Family history of diabetes mellitus: Secondary | ICD-10-CM | POA: Insufficient documentation

## 2017-03-16 DIAGNOSIS — Z6834 Body mass index (BMI) 34.0-34.9, adult: Secondary | ICD-10-CM | POA: Insufficient documentation

## 2017-03-16 DIAGNOSIS — F988 Other specified behavioral and emotional disorders with onset usually occurring in childhood and adolescence: Secondary | ICD-10-CM

## 2017-03-16 DIAGNOSIS — Z8249 Family history of ischemic heart disease and other diseases of the circulatory system: Secondary | ICD-10-CM | POA: Insufficient documentation

## 2017-03-16 DIAGNOSIS — Z7901 Long term (current) use of anticoagulants: Secondary | ICD-10-CM | POA: Insufficient documentation

## 2017-03-16 DIAGNOSIS — F419 Anxiety disorder, unspecified: Secondary | ICD-10-CM | POA: Insufficient documentation

## 2017-03-16 DIAGNOSIS — E669 Obesity, unspecified: Secondary | ICD-10-CM | POA: Insufficient documentation

## 2017-03-16 LAB — POCT INR: INR: 4

## 2017-03-16 NOTE — Progress Notes (Signed)
Assessment & Plan:  Billy Galloway was seen today for hospitalization follow-up.  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 These are reviewed and up-to-date. Referrals have been placed accordingly. Immunizations are up-to-date or declined.    Subjective:   Chief Complaint  Patient presents with  . Hospitalization Follow-up    Patient is here for a hospitalization follow-up.    HPI Billy Galloway 31 y.o. male presents to office today to establish care and for hospital follow up for PE. He is accompanied by his wife today. They recently  moved from [place] (1 week ago).    ADD He has a history of ADD; was seeing a psychiatrist in [place]. Reports his psychiatrist increased office prices and he could not afford to keep up his appointments and therefore was unable to obtain any refills. He has not taken any medication for ADD in over a year. Today he states he does not feel that he needs to restart any medication for ADD. I instructed him that I would be glad to refer him to a psychiatrist of his choosing or [facility] if he changes his mind.   Anxiety Has not taken any benzodiazepines or anxiolytics in over a year. He declines any refills today. I instructed him that I do not prescribe benzodiazepines. He verbalized understanding.    Pulmonary embolism He was diagnosed with an unprovoked pulmonary embolism of the left lower segmental PA, left pleural effusion and LLL PNA approximately 3 weeks ago (02-23-2017). Was started on coumadin in [place]. He has started  having his coumadin and PT/INR levels managed here at this clinic as of 03-14-2017. INR today 4.0. Coumadin on hold at this time with plans to resume at 2.5mg  tomorrow and return to clinic next Tuesday for dosing and lab work.  His risk factors include obesity and sedentary lifestyle. 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 He does not smoke.  His wife reports he had a full cardiac work up in [place] which was negative.  He has an appt with hematology on the 13th with Dr. Clelia CroftShadad. Currently denies chest pain. Endorses mild shortness of breath and "achiness" on left side of chest when he attempts to take a deep breath.    Review of Systems  Constitutional: Negative for fever, malaise/fatigue and weight loss.  HENT: Negative.  Negative for nosebleeds.   Eyes: Negative.  Negative for blurred vision, double vision and photophobia.  Respiratory: Positive for shortness of breath (mild). Negative for cough, hemoptysis, sputum production and wheezing.   Cardiovascular: Negative.  Negative for chest pain, palpitations and leg swelling.  Gastrointestinal: Negative.  Negative for abdominal pain, constipation, diarrhea, heartburn, nausea and vomiting.  Musculoskeletal: Positive for myalgias.  Neurological: Negative.  Negative for dizziness, focal weakness, seizures and headaches.  Endo/Heme/Allergies: Negative for environmental allergies.  Psychiatric/Behavioral: Negative for hallucinations, memory loss, substance abuse and suicidal ideas. The patient is nervous/anxious. The patient does not have insomnia.     Past Medical History:  Diagnosis Date  . ADD (attention  deficit disorder)   . Chicken pox   . PE (pulmonary thromboembolism) (HCC)   . Pneumonia   . Seizures (HCC)    as a baby    Past Surgical History:  Procedure Laterality Date  . NO PAST SURGERIES      Family History    Problem Relation Age of Onset  . Alcohol abuse Father   . Hypertension Father   . Diabetes Father   . Alcohol abuse Paternal Grandfather   . Hypertension Paternal Grandfather   . Diabetes Paternal Grandfather   . CAD Other        GM?  Marland Kitchen. Colon cancer Neg Hx   . Prostate cancer Neg Hx   . Colon polyps Neg Hx   . Esophageal cancer Neg Hx   . Kidney disease Neg Hx   . Gallbladder disease Neg Hx     Social History Reviewed with no changes to be made today.   Outpatient Medications Prior to Visit  Medication Sig Dispense Refill  . warfarin (COUMADIN) 5 MG tablet Take 1.5 tablets (7.5 mg total) by mouth daily. 30 tablet 0  . ALPRAZolam (XANAX) 0.5 MG tablet Take 1 tablet by mouth daily as needed.    Marland Kitchen. amphetamine-dextroamphetamine (ADDERALL) 20 MG tablet Take 1 tablet by mouth daily.  0   No facility-administered medications prior to visit.     No Known Allergies     Objective:    BP 123/87 (BP Location: Left Arm, Patient Position: Sitting, Cuff Size: Normal)   Pulse 95   Temp 98 F (36.7 C) (Oral)   Ht 5\' 9"  (1.753 m)   Wt 234 lb 12.8 oz (106.5 kg)   SpO2 95%   BMI 34.67 kg/m  Wt Readings from Last 3 Encounters:  03/16/17 234 lb 12.8 oz (106.5 kg)  09/06/14 233 lb 4 oz (105.8 kg)  09/03/14 234 lb 4 oz (106.3 kg)    Physical Exam  Constitutional: He is oriented to person, place, and time. He appears well-developed and well-nourished. He is cooperative.  HENT:  Head: Normocephalic and atraumatic.  Eyes: EOM are normal.  Neck: Normal range of motion.  Cardiovascular: Normal rate, regular rhythm, normal heart sounds and intact distal pulses. Exam reveals no gallop and no friction rub.  No murmur heard. Pulmonary/Chest: Effort normal and breath sounds normal. No tachypnea. No respiratory distress. He has no decreased breath sounds. He has no wheezes. He has no rhonchi. He has no rales. He exhibits no tenderness.  Abdominal: Soft. Bowel sounds are normal.   Musculoskeletal: Normal range of motion. He exhibits no edema.  Neurological: He is alert and oriented to person, place, and time. Coordination normal.  Skin: Skin is warm and dry.  Psychiatric: He has a normal mood and affect. His behavior is normal. Judgment and thought content normal.  Nursing note and vitals reviewed.     Patient has been counseled extensively about nutrition and exercise as well as the importance of adherence with medications and regular follow-up. The patient was given clear instructions to go to ER or return to medical center if symptoms don't improve, worsen or new problems develop. The patient verbalized understanding.   Follow-up: Return in about 3 weeks (around 04/06/2017) for physical.  Also Needs appointment with financial representative.Claiborne Rigg.   Zelda W Fleming, FNP-BC [facility] [place], [place] [phone]   03/17/2017, 12:11 AM

## 2017-03-16 NOTE — Progress Notes (Signed)
    Pharmacy Anticoagulation Clinic  Subjective: Patient presents today for INR monitoring. Anticoagulation indication is PE.   Current dose of warfarin: holding warfarin currently. Patient previously on 5 mg daily.  Adherence to warfarin: has been holding doses as instructed.  Signs/symptoms of bleeding: denies Recent changes in diet: trying to eat better. Recent changes in medications: none Upcoming procedures that may impact anticoagulation: none   Objective: Today's INR = 4.0  Lab Results  Component Value Date   INR 4.0 03/16/2017   INR 6.1 03/14/2017   INR 3.89 03/10/2017     Assessment and Plan: Anticoagulation: Patient is still Supratherapeutic based on patient's INR of 4.0 but trending down. Patient has INR goal of 2-3. No s/sx of bleeding currently. Will hold warfarin tonight and then restart 2.5 mg every day starting tomorrow. 4 days of 5 mg daily resulted in INR>6 so 2.5 mg daily is where we will start and then it can be adjusted next week if needed.   Patient verbalized understanding and was provided with written instructions. Next INR check planned for next Tuesday . Patient given return precautions and information on when he should go to the ED or call 911 if bleeding or has s/sx of a clot.

## 2017-03-17 ENCOUNTER — Telehealth: Payer: Self-pay

## 2017-03-17 NOTE — Telephone Encounter (Signed)
-----   Message from Claiborne RiggZelda W Fleming, NP sent at 03/17/2017 12:14 AM EST ----- Regarding: Lab appointment Please call patient and have him schedule a fasting lab appointment. Thank you

## 2017-03-17 NOTE — Telephone Encounter (Signed)
Patient did not answer. Left a voicemail for patient to schedule a fasting lab appointments.

## 2017-03-23 ENCOUNTER — Telehealth: Payer: Self-pay | Admitting: Oncology

## 2017-03-23 NOTE — Telephone Encounter (Signed)
Scheduled appt per 12/12 sch msg. Left voicemail for patient regarding appt.

## 2017-03-24 ENCOUNTER — Encounter: Payer: Self-pay | Admitting: Oncology

## 2017-03-24 ENCOUNTER — Telehealth: Payer: Self-pay | Admitting: Oncology

## 2017-03-24 ENCOUNTER — Ambulatory Visit: Payer: Self-pay | Attending: Internal Medicine | Admitting: Pharmacist

## 2017-03-24 DIAGNOSIS — I2699 Other pulmonary embolism without acute cor pulmonale: Secondary | ICD-10-CM

## 2017-03-24 LAB — POCT INR: INR: 2.3

## 2017-03-24 NOTE — Telephone Encounter (Signed)
Scheduled appt per 12/12 sch msg - left voicemail for patient regarding rescheduled appt.

## 2017-03-30 ENCOUNTER — Ambulatory Visit: Payer: Self-pay | Attending: Nurse Practitioner | Admitting: Pharmacist

## 2017-03-30 ENCOUNTER — Encounter: Payer: Self-pay | Admitting: Oncology

## 2017-03-30 DIAGNOSIS — I2699 Other pulmonary embolism without acute cor pulmonale: Secondary | ICD-10-CM | POA: Insufficient documentation

## 2017-03-30 DIAGNOSIS — Z7901 Long term (current) use of anticoagulants: Secondary | ICD-10-CM | POA: Insufficient documentation

## 2017-03-30 LAB — POCT INR: INR: 2.5

## 2017-04-06 ENCOUNTER — Ambulatory Visit: Payer: Self-pay | Attending: Nurse Practitioner | Admitting: Nurse Practitioner

## 2017-04-06 ENCOUNTER — Ambulatory Visit: Payer: Self-pay | Admitting: Pharmacist

## 2017-04-06 ENCOUNTER — Encounter: Payer: Self-pay | Admitting: Nurse Practitioner

## 2017-04-06 VITALS — BP 130/83 | HR 98 | Temp 98.4°F | Resp 12 | Ht 69.0 in | Wt 240.2 lb

## 2017-04-06 DIAGNOSIS — Z811 Family history of alcohol abuse and dependence: Secondary | ICD-10-CM | POA: Insufficient documentation

## 2017-04-06 DIAGNOSIS — Z7901 Long term (current) use of anticoagulants: Secondary | ICD-10-CM | POA: Insufficient documentation

## 2017-04-06 DIAGNOSIS — Z86711 Personal history of pulmonary embolism: Secondary | ICD-10-CM | POA: Insufficient documentation

## 2017-04-06 DIAGNOSIS — R569 Unspecified convulsions: Secondary | ICD-10-CM | POA: Insufficient documentation

## 2017-04-06 DIAGNOSIS — Z8701 Personal history of pneumonia (recurrent): Secondary | ICD-10-CM | POA: Insufficient documentation

## 2017-04-06 DIAGNOSIS — Z79899 Other long term (current) drug therapy: Secondary | ICD-10-CM | POA: Insufficient documentation

## 2017-04-06 DIAGNOSIS — M79672 Pain in left foot: Secondary | ICD-10-CM | POA: Insufficient documentation

## 2017-04-06 DIAGNOSIS — I2699 Other pulmonary embolism without acute cor pulmonale: Secondary | ICD-10-CM

## 2017-04-06 DIAGNOSIS — Z8249 Family history of ischemic heart disease and other diseases of the circulatory system: Secondary | ICD-10-CM | POA: Insufficient documentation

## 2017-04-06 DIAGNOSIS — Z833 Family history of diabetes mellitus: Secondary | ICD-10-CM | POA: Insufficient documentation

## 2017-04-06 DIAGNOSIS — Z6835 Body mass index (BMI) 35.0-35.9, adult: Secondary | ICD-10-CM | POA: Insufficient documentation

## 2017-04-06 DIAGNOSIS — Z0000 Encounter for general adult medical examination without abnormal findings: Secondary | ICD-10-CM

## 2017-04-06 LAB — POCT INR: INR: 2.7

## 2017-04-06 NOTE — Patient Instructions (Addendum)
Heel Spur A heel spur is a bony growth that forms on the bottom of your heel bone (calcaneus). Heel spurs are common and do not always cause pain. However, heel spurs often cause inflammation in the strong band of tissue that runs underneath the bone of your foot (plantar fascia). When this happens, you may feel pain on the bottom of your foot, near your heel. What are the causes? The cause of heel spurs is not completely understood. They may be caused by pressure on the heel. Or, they may stem from the muscle attachments (tendons) near the spur pulling on the heel. What increases the risk? You may be at risk for a heel spur if you:  Are older than 40.  Are overweight.  Have wear and tear arthritis (osteoarthritis).  Have plantar fascia inflammation.  What are the signs or symptoms? Some people have heel spurs but no symptoms. If you do have symptoms, they may include:  Pain in the bottom of your heel.  Pain that is worse when you first get out of bed.  Pain that gets worse after walking or standing.  How is this diagnosed? Your health care provider may diagnose a heel spur based on your symptoms and a physical exam. You may also have an X-ray of your foot to check for a bony growth coming from the calcaneus. How is this treated? Treatment aims to relieve the pain from the heel spur. This may include:  Stretching exercises.  Losing weight.  Wearing specific shoes, inserts, or orthotics for comfort and support.  Wearing splints at night to properly position your feet.  Taking over-the-counter medicine to relieve pain.  Being treated with high-intensity sound waves to break up the heel spur (extracorporeal shock wave therapy).  Getting steroid injections in your heel to reduce swelling and ease pain.  Having surgery if your heel spur causes long-term (chronic) pain.  Follow these instructions at home:  Take medicines only as directed by your health care provider.  Ask  your health care provider if you should use ice or cold packs on the painful areas of your heel or foot.  Avoid activities that cause you pain until you recover or as directed by your health care provider.  Stretch before exercising or being physically active.  Wear supportive shoes that fit well as directed by your health care provider. You might need to buy new shoes. Wearing old shoes or shoes that do not fit correctly may not provide the support that you need.  Lose weight if your health care provider thinks you should. This can relieve pressure on your foot that may be causing pain and discomfort. Contact a health care provider if:  Your pain continues or gets worse. This information is not intended to replace advice given to you by your health care provider. Make sure you discuss any questions you have with your health care provider. Document Released: 05/05/2005 Document Revised: 09/04/2015 Document Reviewed: 05/30/2013 Elsevier Interactive Patient Education  2018 [employer]vier Inc.  

## 2017-04-06 NOTE — Progress Notes (Addendum)
Assessment & Plan:  Billy Galloway was seen today for annual exam.  Diagnoses and all orders for this visit:  Encounter for routine history and physical exam for male Discussed monthly self testicular exams  Seizures (HCC) Resolved/stable  History of pneumonia -     DG Chest 2 View; Future (evaluate for resolving LLL PNA)   Severe obesity Discussed diet and exercise for person with BMI >25. Instructed: You must burn more calories than you eat. Losing 5 percent of your body weight should be considered a success. In the longer term, losing more than 15 percent of your body weight and staying at this weight is an extremely good result. However, keep in mind that even losing 5 percent of your body weight leads to important health benefits, so try not to get discouraged if you're not able to lose more than this. Will recheck weight in 3-6 months.    Patient has been counseled on age-appropriate routine health concerns for screening and prevention. These are reviewed and up-to-date. Referrals have been placed accordingly. Immunizations are up-to-date or declined.    Subjective:   Chief Complaint  Patient presents with  . Annual Exam    Patient is here for a physical.    HPI Billy Galloway 31 y.o. male presents to office today for annual physical. He is accompanied by his wife.   Left Heel Pain He endorses left heel pain. Onset several months ago. He is concerned there is a clot in his foot. He has a history of PE. He denies any swelling or erythema. Pain is intermittent in nature. Aggravating factors: walking. He has not tried orthotics. He has not taken any medication for the pain. Relieving factors: none.    Review of Systems  Constitutional: Negative for fever, malaise/fatigue and weight loss.  HENT: Negative.  Negative for nosebleeds.   Eyes: Negative.  Negative for blurred vision, double vision and photophobia.  Respiratory: Negative.  Negative for cough and shortness of  breath.   Cardiovascular: Negative.  Negative for chest pain, palpitations and leg swelling.  Gastrointestinal: Negative.  Negative for abdominal pain, constipation, diarrhea, heartburn, nausea and vomiting.  Genitourinary: Negative.   Musculoskeletal: Negative.  Negative for myalgias.       Left heel pain  Skin: Negative.   Neurological: Negative.  Negative for dizziness, focal weakness, seizures and headaches.  Endo/Heme/Allergies: Negative for environmental allergies.  Psychiatric/Behavioral: Negative.  Negative for depression, memory loss, substance abuse and suicidal ideas. The patient is not nervous/anxious and does not have insomnia.     Past Medical History:  Diagnosis Date  . ADD (attention deficit disorder)   . Pneumonia   . Seizures (HCC)    as a baby    Past Surgical History:  Procedure Laterality Date  . NO PAST SURGERIES      Family History  Problem Relation Age of Onset  . Alcohol abuse Father   . Hypertension Father   . Diabetes Father   . Alcohol abuse Paternal Grandfather   . Hypertension Paternal Grandfather   . Diabetes Paternal Grandfather   . CAD Other        GM?  Marland Kitchen Colon cancer Neg Hx   . Prostate cancer Neg Hx   . Colon polyps Neg Hx   . Esophageal cancer Neg Hx   . Kidney disease Neg Hx   . Gallbladder disease Neg Hx     Social History Reviewed with no changes to be made today.   Outpatient Medications Prior  to Visit  Medication Sig Dispense Refill  . warfarin (COUMADIN) 5 MG tablet Take 1.5 tablets (7.5 mg total) by mouth daily. 30 tablet 0  . ALPRAZolam (XANAX) 0.5 MG tablet Take 1 tablet by mouth daily as needed.    Marland Kitchen. amphetamine-dextroamphetamine (ADDERALL) 20 MG tablet Take 1 tablet by mouth daily.  0   No facility-administered medications prior to visit.     No Known Allergies     Objective:    BP 130/83 (BP Location: Right Arm, Patient Position: Sitting, Cuff Size: Large)   Pulse 98   Temp 98.4 F (36.9 C) (Oral)   Resp 12    Ht 5\' 9"  (1.753 m)   Wt 240 lb 3.2 oz (109 kg)   SpO2 96%   BMI 35.47 kg/m  Wt Readings from Last 3 Encounters:  04/06/17 240 lb 3.2 oz (109 kg)  03/16/17 234 lb 12.8 oz (106.5 kg)  09/06/14 233 lb 4 oz (105.8 kg)    Physical Exam  Constitutional: He is oriented to person, place, and time. He appears well-developed and well-nourished. He is cooperative.  HENT:  Head: Normocephalic and atraumatic.  Right Ear: Hearing, tympanic membrane, external ear and ear canal normal.  Left Ear: Hearing, external ear and ear canal normal.  Ears:  Nose: Mucosal edema and rhinorrhea present. Right sinus exhibits no maxillary sinus tenderness and no frontal sinus tenderness. Left sinus exhibits no maxillary sinus tenderness and no frontal sinus tenderness.  Mouth/Throat: Uvula is midline, oropharynx is clear and moist and mucous membranes are normal. He does not have dentures. No oral lesions. No trismus in the jaw. Abnormal dentition. No dental abscesses, uvula swelling, lacerations or dental caries.  Eyes: Conjunctivae and EOM are normal. Pupils are equal, round, and reactive to light. Right eye exhibits no discharge. Left eye exhibits no discharge. No scleral icterus.  Glasses removed during eye exam  Neck: Normal range of motion. Neck supple. No thyromegaly present.  Cardiovascular: Normal rate, regular rhythm, normal heart sounds and intact distal pulses. Exam reveals no gallop and no friction rub.  No murmur heard. Pulmonary/Chest: Effort normal and breath sounds normal. No tachypnea. No respiratory distress. He has no decreased breath sounds. He has no wheezes. He has no rhonchi. He has no rales. He exhibits no tenderness.  Abdominal: Soft. Bowel sounds are normal. He exhibits no distension and no mass. There is no tenderness. There is no rebound and no guarding. Hernia confirmed negative in the right inguinal area and confirmed negative in the left inguinal area.  Genitourinary: Testes normal  and penis normal. Cremasteric reflex is present. Right testis shows no mass, no swelling and no tenderness. Right testis is descended. Cremasteric reflex is not absent on the right side. Left testis shows no mass, no swelling and no tenderness. Left testis is descended. Cremasteric reflex is not absent on the left side. Circumcised. No phimosis, paraphimosis, hypospadias, penile erythema or penile tenderness. No discharge found.  Musculoskeletal: Normal range of motion. He exhibits no edema.       Feet:  Lymphadenopathy:    He has no cervical adenopathy.       Right: No inguinal adenopathy present.       Left: No inguinal adenopathy present.  Neurological: He is alert and oriented to person, place, and time. He displays normal reflexes. He exhibits normal muscle tone. Coordination normal.  Skin: Skin is warm and dry. No rash noted. No erythema. No pallor.  Psychiatric: He has a normal mood and  affect. His behavior is normal. Judgment and thought content normal.  Nursing note and vitals reviewed.        Patient has been counseled extensively about nutrition and exercise as well as the importance of adherence with medications and regular follow-up. The patient was given clear instructions to go to ER or return to medical center if symptoms don't improve, worsen or new problems develop. The patient verbalized understanding.   Follow-up: Return in about 6 months (around 10/05/2017), or if symptoms worsen or fail to improve, for needs fasting labs as soon as possible.   Claiborne RiggZelda W Fleming, FNP-BC [facility] [place], [place] [phone]   04/06/2017, 4:38 PM

## 2017-04-07 ENCOUNTER — Telehealth: Payer: Self-pay | Admitting: Oncology

## 2017-04-07 ENCOUNTER — Ambulatory Visit (HOSPITAL_BASED_OUTPATIENT_CLINIC_OR_DEPARTMENT_OTHER): Payer: Self-pay | Admitting: Oncology

## 2017-04-07 VITALS — BP 123/88 | HR 83 | Temp 98.7°F | Resp 18 | Ht 69.0 in | Wt 239.7 lb

## 2017-04-07 DIAGNOSIS — I2699 Other pulmonary embolism without acute cor pulmonale: Secondary | ICD-10-CM

## 2017-04-07 DIAGNOSIS — F988 Other specified behavioral and emotional disorders with onset usually occurring in childhood and adolescence: Secondary | ICD-10-CM

## 2017-04-07 NOTE — Telephone Encounter (Signed)
Gave avs and calendar for June 2019  °

## 2017-04-07 NOTE — Progress Notes (Signed)
Reason for Referral: Pulmonary embolism  HPI: 31 year old gentleman currently of [place] where he was born and raised but lived in [place] up until about a month ago.  He reports no major medical problems in his past medical history with remote history of ADD and seizure as a child.  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  He developed acute symptoms of back pain, shoulder pain and difficulty breathing.  He was seen urgently in the emergency department and was diagnosed with pulmonary embolism and possibly pneumonia.  He was hospitalized for about a week while living in [place] and was started on initially on heparin and subsequently on Coumadin.  He has been on Coumadin since that time with INR levels that are therapeutic.  He denies any bleeding complications.  He denies any recent hospitalizations or recurrent thrombosis.  He denies any previous clotting episodes including DVT or pulmonary embolism.  No family history of malignancy or embolism.  He does not report any headaches, blurry vision, syncope or seizures.  He does not report any fevers or chills or sweats.  He does not report any cough, wheezing or hemoptysis.  He does not report any nausea, vomiting or abdominal pain.  He does not up any frequency urgency or hesitancy.  He does not up any skeletal complaints.  Remaining review of systems unremarkable.   Past Medical History:  Diagnosis Date  . ADD (attention deficit disorder)   . Pneumonia   . Seizures (HCC)    as a baby  :  Past Surgical History:  Procedure Laterality Date  . NO PAST SURGERIES    :   Current Outpatient Medications:  .  ALPRAZolam (XANAX) 0.5 MG tablet, Take 1 tablet by mouth daily as needed., Disp: , Rfl:  .  amphetamine-dextroamphetamine (ADDERALL) 20 MG tablet, Take 1 tablet by mouth daily., Disp: , Rfl: 0 .  warfarin (COUMADIN) 5 MG tablet, Take 1.5  tablets (7.5 mg total) by mouth daily., Disp: 30 tablet, Rfl: 0:  No Known Allergies:  Family History  Problem Relation Age of Onset  . Alcohol abuse Father   . Hypertension Father   . Diabetes Father   . Alcohol abuse Paternal Grandfather   . Hypertension Paternal Grandfather   . Diabetes Paternal Grandfather   . CAD Other        GM?  Marland Kitchen Colon cancer Neg Hx   . Prostate cancer Neg Hx   . Colon polyps Neg Hx   . Esophageal cancer Neg Hx   . Kidney disease Neg Hx   . Gallbladder disease Neg Hx   :  Social History   Socioeconomic History  . Marital status: Single    Spouse name: Not on file  . Number of children: 0  . Years of education: Not on file  . Highest education level: Not on file  Social Needs  . Financial resource strain: Not on file  . Food insecurity - worry: Not on file  . Food insecurity - inability: Not on file  . Transportation needs - medical: Not on file  . Transportation needs - non-medical: Not on file  Occupational History  . Occupation: some college, works for [employer] (from home)  Tobacco Use  . Smoking status: Never Smoker  . Smokeless tobacco: Never Used  Substance and Sexual Activity  . Alcohol use: No    Alcohol/week: 0.0  oz    Frequency: Never    Comment: rare   . Drug use: No  . Sexual activity: Not on file  Other Topics Concern  . Not on file  Social History Narrative   Lives by himself  :  Pertinent items are noted in HPI.  Exam: Blood pressure 123/88, pulse 83, temperature 98.7 F (37.1 C), temperature source Oral, resp. rate 18, height 5\' 9"  (1.753 m), weight 239 lb 11.2 oz (108.7 kg), SpO2 98 %.  ECOG 0 General appearance: alert and cooperative appeared without distress. Throat: Oral thrush or ulcers. Neck: no adenopathy or masses. Resp: clear to auscultation bilaterally without wheezes or rhonchi. Chest wall: no tenderness Cardio: regular rate and rhythm, S1, S2 normal, no murmur, click, rub or gallop GI: soft, non-tender;  bowel sounds normal; no masses,  no organomegaly Extremities: extremities normal, atraumatic, no cyanosis or edema Pulses: 2+ and symmetric Skin: Skin color, texture, turgor normal. No rashes or lesions Lymph nodes: Cervical, supraclavicular, and axillary nodes normal.  CBC    Component Value Date/Time   WBC 9.7 09/03/2014 1617   RBC 5.26 09/03/2014 1617   HGB 15.4 09/03/2014 1617   HCT 44.5 09/03/2014 1617   PLT 281.0 09/03/2014 1617   MCV 84.5 09/03/2014 1617   MCH 29.4 05/31/2014 1638   MCHC 34.6 09/03/2014 1617   RDW 13.6 09/03/2014 1617   LYMPHSABS 2.0 09/03/2014 1617   MONOABS 1.0 09/03/2014 1617   EOSABS 0.2 09/03/2014 1617   BASOSABS 0.1 09/03/2014 1617     Chemistry      Component Value Date/Time   NA 140 05/31/2014 1638   K 4.6 05/31/2014 1638   CL 106 05/31/2014 1638   CO2 24 05/31/2014 1638   BUN 20 05/31/2014 1638   CREATININE 0.93 05/31/2014 1638      Component Value Date/Time   CALCIUM 9.2 05/31/2014 1638   ALKPHOS 83 05/31/2014 1638   AST 21 05/31/2014 1638   ALT 27 05/31/2014 1638   BILITOT 0.4 05/31/2014 1638       Assessment and Plan:   31 year old gentleman with the following issues:  1.  Pulmonary embolism diagnosed in November 2018.  He presented acutely with shortness of breath, difficulty breathing and shoulder pain.  He was diagnosed with a pulmonary embolism at the time and was started on warfarin and has been on it since that time.  No clear-cut provoking symptoms although he does report driving for an extended period of time close to 8 hours a day for many days leading up to the episode.  He does not have any family history of thrombosis and no other provoking symptoms.  The natural course of acquired and inherited thrombophilia was discussed today with the patient and his wife.  The treatment for his pulmonary embolism include at least 6 months of anticoagulation with consideration for possible longer depending on screening for inherited  thrombophilia.  A hypercoagulable panel will be helpful in the setting to determine whether longer duration of therapy is needed.  Given the fact that he is on warfarin, I recommend obtaining this panel after he completes 6 months of therapy.  The plan is to complete 6 months of warfarin therapy which will conclude at the end of May.  He will return 4 weeks after that to have his hypercoagulable panel checked and evaluated at the time.  Depending on these results, we will consider longer anticoagulation course if needed to.   Alternative anticoagulation agents were discussed such as  Xarelto or Eliquis but I see no added advantage to these agents at this time.  If his INR levels become difficult to control in the future, switching to a different agent might be warranted.  In the meantime, we have recommended also lifestyle modification including weight loss and increase activity  2.  Age-appropriate cancer screening: We have discussed importance of doing that moving forward.  He has no signs or symptoms to suggest occult malignancy.  3. Follow up: in 6 months after obtaining hypercoagulable panel.

## 2017-04-28 ENCOUNTER — Ambulatory Visit: Payer: Self-pay | Attending: Nurse Practitioner | Admitting: Pharmacist

## 2017-04-28 DIAGNOSIS — Z7901 Long term (current) use of anticoagulants: Secondary | ICD-10-CM | POA: Insufficient documentation

## 2017-04-28 DIAGNOSIS — Z7689 Persons encountering health services in other specified circumstances: Secondary | ICD-10-CM | POA: Insufficient documentation

## 2017-04-28 DIAGNOSIS — I2699 Other pulmonary embolism without acute cor pulmonale: Secondary | ICD-10-CM | POA: Insufficient documentation

## 2017-04-28 LAB — POCT INR: INR: 2.5

## 2017-04-28 MED ORDER — WARFARIN SODIUM 5 MG PO TABS: 2.5000 mg | ORAL_TABLET | Freq: Every day | ORAL | 0 refills | Status: DC

## 2017-04-28 NOTE — Addendum Note (Signed)
Addended by: Santa LighterHAMMER, Meiko Stranahan K on: 04/28/2017 03:12 PM   Modules accepted: Orders

## 2017-05-31 ENCOUNTER — Ambulatory Visit: Payer: Self-pay | Attending: Nurse Practitioner | Admitting: Pharmacist

## 2017-05-31 DIAGNOSIS — I2699 Other pulmonary embolism without acute cor pulmonale: Secondary | ICD-10-CM | POA: Insufficient documentation

## 2017-05-31 DIAGNOSIS — Z7901 Long term (current) use of anticoagulants: Secondary | ICD-10-CM | POA: Insufficient documentation

## 2017-05-31 LAB — POCT INR: INR: 2.6

## 2017-06-27 ENCOUNTER — Other Ambulatory Visit: Payer: Self-pay | Admitting: Pharmacist

## 2017-06-27 MED ORDER — WARFARIN SODIUM 5 MG PO TABS: 2.5000 mg | ORAL_TABLET | Freq: Every day | ORAL | 1 refills | Status: AC

## 2017-06-28 ENCOUNTER — Encounter: Payer: Self-pay | Admitting: Pharmacist

## 2017-07-08 ENCOUNTER — Telehealth: Payer: Self-pay | Admitting: Pharmacist

## 2017-07-08 NOTE — Telephone Encounter (Signed)
Patient has moved to [place] and has established with Coumadin Clinic there. Will no longer follow with [facility].

## 2017-09-24 ENCOUNTER — Other Ambulatory Visit: Payer: Self-pay | Admitting: Internal Medicine

## 2017-09-30 ENCOUNTER — Inpatient Hospital Stay: Payer: 59 | Attending: Oncology

## 2017-10-06 ENCOUNTER — Inpatient Hospital Stay: Payer: 59 | Admitting: Oncology

## 2017-11-30 ENCOUNTER — Other Ambulatory Visit: Payer: Self-pay | Admitting: Internal Medicine

## 2018-04-12 HISTORY — PX: CHEST TUBE INSERTION: SHX231

## 2019-09-11 ENCOUNTER — Other Ambulatory Visit: Payer: Self-pay

## 2019-09-11 ENCOUNTER — Other Ambulatory Visit: Payer: Self-pay | Admitting: Oncology

## 2019-09-11 ENCOUNTER — Ambulatory Visit: Payer: 59 | Attending: Nurse Practitioner | Admitting: Nurse Practitioner

## 2019-09-11 ENCOUNTER — Encounter: Payer: Self-pay | Admitting: Nurse Practitioner

## 2019-09-11 VITALS — Ht 69.0 in | Wt 206.0 lb

## 2019-09-11 DIAGNOSIS — I2699 Other pulmonary embolism without acute cor pulmonale: Secondary | ICD-10-CM | POA: Diagnosis not present

## 2019-09-11 DIAGNOSIS — Z130 Encounter for screening for diseases of the blood and blood-forming organs and certain disorders involving the immune mechanism: Secondary | ICD-10-CM

## 2019-09-11 DIAGNOSIS — Z131 Encounter for screening for diabetes mellitus: Secondary | ICD-10-CM

## 2019-09-11 DIAGNOSIS — Z7689 Persons encountering health services in other specified circumstances: Secondary | ICD-10-CM | POA: Diagnosis not present

## 2019-09-11 DIAGNOSIS — Z13220 Encounter for screening for lipoid disorders: Secondary | ICD-10-CM

## 2019-09-11 DIAGNOSIS — Z13228 Encounter for screening for other metabolic disorders: Secondary | ICD-10-CM

## 2019-09-11 NOTE — Progress Notes (Signed)
Virtual Visit via Telephone Note Due to national recommendations of social distancing due to [place] 19, telehealth visit is felt to be most appropriate for this patient at this time.  I discussed the limitations, risks, security and privacy concerns of performing an evaluation and management service by telephone and the availability of in person appointments. I also discussed with the patient that there may be a patient responsible charge related to this service. The patient expressed understanding and agreed to proceed.    I connected with Billy Galloway on 09/11/19  at  10:30 AM EDT  EDT by telephone and verified that I am speaking with the correct person using two identifiers.   Consent I discussed the limitations, risks, security and privacy concerns of performing an evaluation and management service by telephone and the availability of in person appointments. I also discussed with the patient that there may be a patient responsible charge related to this service. The patient expressed understanding and agreed to proceed.   Location of Patient: Private Residence   Location of Provider: [place] participating in Telemedicine visit: [name] FNP-BC [place]    History of Present Illness: Telemedicine visit for: Re establish Care He was previously a patient of mine in 2018 however it appears that after I initially saw him he moved back to [place] and has now returned to [place] since April 2021.  He has a history of PE/PNA(2018) was referred to Hematology after being placed on coumadin.  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  No previous history of PE or DVT prior to 2018.   Per hematology note on 04/07/2017: No clear-cut provoking symptoms although he does report driving for an extended period of time close to 8 hours a day  for many days leading up to the episode.  He does not have any family history of thrombosis and no other provoking symptoms.  The natural course of acquired and inherited thrombophilia was discussed today with the patient and his wife.  The treatment for his pulmonary embolism include at least 6 months of anticoagulation with consideration for possible longer depending on screening for inherited thrombophilia.  A hypercoagulable panel will be helpful in the setting to determine whether longer duration of therapy is needed.  Given the fact that he is on warfarin, I recommend obtaining this panel after he completes 6 months of therapy.  Unfortunately [name] did not follow up nor did he DISCONTINUE his coumadin after 6 months.  Today he and his wife tell me that he has continued on coumadin due to fear of having another PE. While he was under the care of under PCP while in [place] his coumadin was never stopped. He does state that it was explained to him there was no reason for him to continue however since no one could tell him that he wouldn't ever have another PE he requested to stay on coumadin. He also had a lung collapse requiring chest tube placement in the same lung while he was in [place] and wonders if his previous PE and lung collapse would be related.   I have explained to he and his wife today that I see no clear cut reason to continue coumadin and unless his hypercoag or thrombophilia panel return positive I would not refill his coumadin as this  goes against my better judgement. guidelines. He also saw a pulmonologist after his lung collapsed and was reassured that there was no clear reason for him to continue coumadin.  I will refer him to hematology for further evaluation. He plans to stop taking coumadin so that hypercoag panel can be performed while off anticoagulant.  He does not currently endorses any chest pain, shortness of breath or hemoptysis.     Past Medical History:  Diagnosis Date    ADD (attention deficit disorder)    Pneumonia    Pulmonary embolism ([place])    Seizures ([place])    as a baby    Past Surgical History:  Procedure Laterality Date   CHEST TUBE INSERTION  2020   NO PAST SURGERIES      Family History  Problem Relation Age of Onset   Alcohol abuse Father    Hypertension Father    Diabetes Father    Alcohol abuse Paternal Grandfather    Hypertension Paternal Grandfather    Diabetes Paternal Grandfather    CAD Other        GM?   Colon cancer Neg Hx    Prostate cancer Neg Hx    Colon polyps Neg Hx    Esophageal cancer Neg Hx    Kidney disease Neg Hx    Gallbladder disease Neg Hx     Social History   Socioeconomic History   Marital status: Single    Spouse name: Not on file   Number of children: 0   Years of education: Not on file   Highest education level: Not on file  Occupational History   Occupation: some college, works for [employer] (from home)  Tobacco Use   Smoking status: Never Smoker   Smokeless tobacco: Never Used  Substance and Sexual Activity   Alcohol use: No    Alcohol/week: 0.0 standard drinks    Comment: rare    Drug use: No   Sexual activity: Yes  Other Topics Concern   Not on file  Social History Narrative   Lives by himself   Social Determinants of Health   Financial Resource Strain:    Difficulty of Paying Living Expenses:   Food Insecurity:    Worried About Charity fundraiser in the Last Year:    Arboriculturist in the Last Year:   Transportation Needs:    Film/video editor (Medical):    Lack of Transportation (Non-Medical):   Physical Activity:    Days of Exercise per Week:    Minutes of Exercise per Session:   Stress:    Feeling of Stress :   Social Connections:    Frequency of Communication with Friends and Family:    Frequency of Social Gatherings with Friends and Family:    Attends Religious Services:    Active Member of Clubs or Organizations:     Attends Music therapist:    Marital Status:      Observations/Objective: Awake, alert and oriented x 3   Review of Systems  Constitutional: Negative for fever, malaise/fatigue and weight loss.  HENT: Negative.  Negative for nosebleeds.   Eyes: Negative.  Negative for blurred vision, double vision and photophobia.  Respiratory: Negative.  Negative for cough and shortness of breath.   Cardiovascular: Negative.  Negative for chest pain, palpitations and leg swelling.  Gastrointestinal: Negative.  Negative for heartburn, nausea and vomiting.  Musculoskeletal: Negative.  Negative for myalgias.  Neurological: Negative.  Negative for dizziness, focal weakness, seizures  and headaches.  Psychiatric/Behavioral: Negative.  Negative for suicidal ideas.    Assessment and Plan: Jaimere was seen today for establish care.  Diagnoses and all orders for this visit:  Encounter to establish care  Other pulmonary embolism without acute cor pulmonale, unspecified chronicity ([place]) -     Ambulatory referral to Hematology -     Hypercoagulable panel, comprehensive; Future -     Von Willebrand panel; Future  Screening for deficiency anemia -     CBC; Future  Encounter for screening for diabetes mellitus -     Hemoglobin A1c; Future  Lipid screening -     Lipid panel; Future  Screening for metabolic disorder -     [id number]; Future     Follow Up Instructions Return in about 2 months (around 11/11/2019).     I discussed the assessment and treatment plan with the patient. The patient was provided an opportunity to ask questions and all were answered. The patient agreed with the plan and demonstrated an understanding of the instructions.   The patient was advised to call back or seek an in-person evaluation if the symptoms worsen or if the condition fails to improve as anticipated.  I provided 20 minutes of non-face-to-face time during this encounter including median intraservice  time, reviewing previous notes, labs, imaging, medications and explaining diagnosis and management.  Gildardo Pounds, FNP-BC

## 2019-09-12 ENCOUNTER — Telehealth: Payer: Self-pay | Admitting: Oncology

## 2019-09-12 NOTE — Telephone Encounter (Signed)
Scheduled per 6/1 sch message. Unable to reach pt. Left voicemail- appts on 7/1 and 7/29

## 2019-09-19 ENCOUNTER — Other Ambulatory Visit: Payer: 59

## 2019-10-03 ENCOUNTER — Other Ambulatory Visit: Payer: 59

## 2019-10-11 ENCOUNTER — Inpatient Hospital Stay: Payer: 59 | Attending: Oncology

## 2019-10-11 ENCOUNTER — Other Ambulatory Visit: Payer: Self-pay

## 2019-10-11 DIAGNOSIS — Z86718 Personal history of other venous thrombosis and embolism: Secondary | ICD-10-CM | POA: Insufficient documentation

## 2019-10-11 DIAGNOSIS — I2699 Other pulmonary embolism without acute cor pulmonale: Secondary | ICD-10-CM

## 2019-10-11 LAB — ANTITHROMBIN III: AntiThromb III Func: 95 % (ref 75–120)

## 2019-10-12 ENCOUNTER — Encounter: Payer: Self-pay | Admitting: Oncology

## 2019-10-12 LAB — BETA-2-GLYCOPROTEIN I ABS, IGG/M/A
Beta-2 Glyco I IgG: <9 GPI IgG units (ref 0–20)
Beta-2-Glycoprotein I IgA: <9 GPI IgA units (ref 0–25)
Beta-2-Glycoprotein I IgM: <9 GPI IgM units (ref 0–32)

## 2019-10-12 LAB — LUPUS ANTICOAGULANT PANEL
DRVVT: 38.8 s (ref 0.0–47.0)
PTT Lupus Anticoagulant: 34.7 s (ref 0.0–51.9)

## 2019-10-12 LAB — PROTEIN S, TOTAL: Protein S Ag, Total: 93 % (ref 60–150)

## 2019-10-12 LAB — CARDIOLIPIN ANTIBODIES, IGG, IGM, IGA
Anticardiolipin IgA: <9 APL U/mL (ref 0–11)
Anticardiolipin IgG: <9 GPL U/mL (ref 0–14)
Anticardiolipin IgM: 15 MPL U/mL — ABNORMAL HIGH (ref 0–12)

## 2019-10-12 LAB — PROTEIN S ACTIVITY: Protein S Activity: 107 % (ref 63–140)

## 2019-10-12 LAB — PROTEIN C, TOTAL: Protein C, Total: 176 % — ABNORMAL HIGH (ref 60–150)

## 2019-10-12 LAB — HOMOCYSTEINE: Homocysteine: 6.1 umol/L (ref 0.0–14.5)

## 2019-10-12 LAB — PROTEIN C ACTIVITY: Protein C Activity: 149 % (ref 73–180)

## 2019-10-16 LAB — PROTHROMBIN GENE MUTATION

## 2019-10-16 LAB — FACTOR 5 LEIDEN

## 2019-11-01 ENCOUNTER — Emergency Department (HOSPITAL_COMMUNITY): Payer: 59

## 2019-11-01 ENCOUNTER — Encounter (HOSPITAL_BASED_OUTPATIENT_CLINIC_OR_DEPARTMENT_OTHER): Payer: Self-pay | Admitting: Emergency Medicine

## 2019-11-01 ENCOUNTER — Other Ambulatory Visit: Payer: Self-pay

## 2019-11-01 ENCOUNTER — Emergency Department (HOSPITAL_BASED_OUTPATIENT_CLINIC_OR_DEPARTMENT_OTHER)
Admission: EM | Admit: 2019-11-01 | Discharge: 2019-11-01 | Disposition: A | Discharge status: Home / Self Care | Payer: 59 | Source: Home / Self Care | Attending: Emergency Medicine | Admitting: Emergency Medicine

## 2019-11-01 ENCOUNTER — Encounter (HOSPITAL_COMMUNITY): Payer: Self-pay | Admitting: Emergency Medicine

## 2019-11-01 ENCOUNTER — Emergency Department (HOSPITAL_COMMUNITY)
Admission: EM | Admit: 2019-11-01 | Discharge: 2019-11-01 | Disposition: A | Discharge status: Home / Self Care | Payer: 59 | Attending: Emergency Medicine | Admitting: Emergency Medicine

## 2019-11-01 DIAGNOSIS — R0789 Other chest pain: Secondary | ICD-10-CM | POA: Insufficient documentation

## 2019-11-01 DIAGNOSIS — Z5321 Procedure and treatment not carried out due to patient leaving prior to being seen by health care provider: Secondary | ICD-10-CM | POA: Insufficient documentation

## 2019-11-01 DIAGNOSIS — R002 Palpitations: Secondary | ICD-10-CM | POA: Insufficient documentation

## 2019-11-01 DIAGNOSIS — R079 Chest pain, unspecified: Secondary | ICD-10-CM

## 2019-11-01 HISTORY — DX: Pneumothorax, unspecified: J93.9

## 2019-11-01 LAB — CBC
HCT: 43.3 % (ref 39.0–52.0)
Hemoglobin: 14.3 g/dL (ref 13.0–17.0)
MCH: 29.3 pg (ref 26.0–34.0)
MCHC: 33 g/dL (ref 30.0–36.0)
MCV: 88.7 fL (ref 80.0–100.0)
Platelets: 234 10*3/uL (ref 150–400)
RBC: 4.88 MIL/uL (ref 4.22–5.81)
RDW: 12.9 % (ref 11.5–15.5)
WBC: 7.8 10*3/uL (ref 4.0–10.5)
nRBC: 0 % (ref 0.0–0.2)

## 2019-11-01 LAB — BASIC METABOLIC PANEL
Anion gap: 9 (ref 5–15)
BUN: 19 mg/dL (ref 6–20)
CO2: 26 mmol/L (ref 22–32)
Calcium: 9.2 mg/dL (ref 8.9–10.3)
Chloride: 103 mmol/L (ref 98–111)
Creatinine, Ser: 0.95 mg/dL (ref 0.61–1.24)
GFR calc Af Amer: >60 mL/min (ref >60)
GFR calc non Af Amer: >60 mL/min (ref >60)
Glucose, Bld: 96 mg/dL (ref 70–99)
Potassium: 3.5 mmol/L (ref 3.5–5.1)
Sodium: 138 mmol/L (ref 135–145)

## 2019-11-01 LAB — TROPONIN I (HIGH SENSITIVITY): Troponin I (High Sensitivity): 5 ng/L (ref <18)

## 2019-11-01 LAB — PROTIME-INR
INR: 0.9 (ref 0.8–1.2)
Prothrombin Time: 11.9 seconds (ref 11.4–15.2)

## 2019-11-01 MED ORDER — SODIUM CHLORIDE 0.9% FLUSH: 3.0000 mL | Freq: Once | INTRAVENOUS | Status: DC

## 2019-11-01 NOTE — ED Notes (Signed)
Pt checked out AMA. 

## 2019-11-01 NOTE — ED Notes (Signed)
Pt was at [facility] prior to coming here and had an EKG, lab work, and chest xray

## 2019-11-01 NOTE — ED Triage Notes (Signed)
Patient reports right chest pressure this evening , denies SOB , no emesis or diaphoresis . No cough or fever .

## 2019-11-01 NOTE — ED Provider Notes (Signed)
Emergency Department Provider Note  I have reviewed the triage vital signs and the nursing notes.  HISTORY  Chief Complaint Chest Pain   HPI Billy Galloway is a 34 y.o. male the history of pulmonary embolism not on blood thinners right now because he is got a work-up for it.  Also has a history of pneumothorax in the distant past.  He states that today in the last night he had some right-sided chest pressure when he laid down, unable to describe it any further than that.  Patient states that he has some palpitations around the same time.  Patient states that this worried him and brought him here for further evaluation.  He went to [facility] initially waited 2 to 3 hours had a chest x-ray, EKG and labs all of which were reviewed in the chart and were unremarkable.  Patient states he only notices at night.  He gets a bit short of breath when he chased the dog around but that is been going on for couple years.  He has no other associated symptoms.  No alcohol, drugs or tobacco.  Does drink a lot of caffeine but no change in that recently.  Patient states that he does any change in his exercise patterns.  He has no dyspnea on exertion.  He has no lower extremity swelling.  He does state the palpitations as well.   No other associated or modifying symptoms.    Past Medical History:  Diagnosis Date  . ADD (attention deficit disorder)   . Pneumonia   . Pneumothorax   . Pulmonary embolism (HCC)   . Seizures (HCC)    as a baby    Patient Active Problem List   Diagnosis Date Noted  . Seizures (HCC) 04/06/2017  . Pulmonary embolism (HCC) 03/14/2017  . Annual physical exam 09/03/2014  . ADD (attention deficit disorder) 05/31/2014  . Anxiety 05/31/2014    Past Surgical History:  Procedure Laterality Date  . CHEST TUBE INSERTION  2020  . NO PAST SURGERIES      Current Outpatient Rx  . Order #: [id number] Class: Historical Med  . Order #: [id number] Class: Historical Med  . Order #:  [id number] Class: Normal    Allergies Patient has no known allergies.  Family History  Problem Relation Age of Onset  . Alcohol abuse Father   . Hypertension Father   . Diabetes Father   . Alcohol abuse Paternal Grandfather   . Hypertension Paternal Grandfather   . Diabetes Paternal Grandfather   . CAD Other        GM?  Marland Kitchen Colon cancer Neg Hx   . Prostate cancer Neg Hx   . Colon polyps Neg Hx   . Esophageal cancer Neg Hx   . Kidney disease Neg Hx   . Gallbladder disease Neg Hx     Social History Social History   Tobacco Use  . Smoking status: Never Smoker  . Smokeless tobacco: Never Used  Vaping Use  . Vaping Use: Never used  Substance Use Topics  . Alcohol use: No    Alcohol/week: 0.0 standard drinks    Comment: rare   . Drug use: No    Review of Systems  All other systems negative except as documented in the HPI. All pertinent positives and negatives as reviewed in the HPI. ____________________________________________  PHYSICAL EXAM:  VITAL SIGNS: ED Triage Vitals  Enc Vitals Group     BP 11/01/19 0440 125/88     Pulse Rate 11/01/19  0440 74     Resp 11/01/19 0440 15     Temp 11/01/19 0440 98.2 F (36.8 C)     Temp Source 11/01/19 0440 Oral     SpO2 11/01/19 0440 98 %     Weight 11/01/19 0436 210 lb (95.3 kg)     Height 11/01/19 0436 5\' 8"  (1.727 m)     Head Circumference --      Peak Flow --      Pain Score 11/01/19 0436 0     Pain Loc --      Pain Edu? --      Excl. in GC? --     Constitutional: Alert and oriented. Well appearing and in no acute distress. Eyes: Conjunctivae are normal. PERRL. EOMI. Head: Atraumatic. ose: No congestion/rhinnorhea. Mouth/Throat: Mucous membranes are moist.  Oropharynx non-erythematous. Neck: No stridor.  No meningeal signs.   Cardiovascular: Normal rate, regular rhythm. Good peripheral circulation. Grossly normal heart sounds.   Respiratory: Normal respiratory effort.  No retractions. Lungs  CTAB. Gastrointestinal: Soft and nontender. No distention.  Musculoskeletal: No lower extremity tenderness nor edema. No gross deformities of extremities. Neurologic:  Normal speech and language. No gross focal neurologic deficits are appreciated.  Skin:  Skin is warm, dry and intact. No rash noted.  ____________________________________________   LABS (all labs ordered are listed, but only abnormal results are displayed)  Labs Reviewed - No data to display ____________________________________________  EKG   EKG Interpretation  Date/Time:    Ventricular Rate:    PR Interval:    QRS Duration:   QT Interval:    QTC Calculation:   R Axis:     Text Interpretation:         ____________________________________________  RADIOLOGY  DG Chest 2 View  Result Date: 11/01/2019 CLINICAL DATA:  Chest pain. EXAM: CHEST - 2 VIEW COMPARISON:  None. FINDINGS: The heart size and mediastinal contours are within normal limits. Both lungs are clear. The visualized skeletal structures are unremarkable. IMPRESSION: No active cardiopulmonary disease. Electronically Signed   By: 11/03/2019 M.D.   On: 11/01/2019 02:37   ____________________________________________  PROCEDURES  Procedure(s) performed:   Procedures ____________________________________________  INITIAL IMPRESSION / ASSESSMENT AND PLAN / ED COURSE   This patient presents to the ED for concern of palpitations and right-sided chest pressure in the area of his previous pulmonary embolus and pneumothorax, this involves an extensive number of treatment options, and is a complaint that carries with it a high risk of complications and morbidity.  The differential diagnosis includes repeat pulmonary embolus, repeat pneumothorax, palpitations, anxiety, SVT, atrial fibrillation, musculoskeletal.     Lab Tests:   I reviewed, and interpreted labs, which included normal CBC, troponin, BNP  Medicines ordered:   None indicated  as he is not currently symptomatic.  Imaging Studies ordered:   I independently visualized and interpreted imaging chest x-ray from [facility] which showed no obvious abnormality specifically no evidence of a [place] hump to suggest pulmonary embolus nor does it have pneumothorax, consolidation or other concerning findings.  Additional history obtained:   Additional history obtained from his wife at bedside  Previous records obtained and reviewed in epic  Consultations Obtained:   I consulted no one and discussed lab and imaging findings  Overall I feel the patient likely has some palpitations and he gets worried about its causing the rest of the symptoms.  It is possible that he has some primary lung problem and I discussed the possibility of  repeat PE as he is not on his Coumadin at this time.  It seems like last time his PE was probably provoked as he had sat in a car for approximately 8 hours prior to getting it.  I discussed doing a CT scan to rule this out however patient has no other risk factors for pulmonary embolus besides his history.  We decided that the symptoms were nothing like when he had his blood clots in the past and to forego that CT scan.  Patient will follow up with cardiology if he continues to have palpitations but will try to come to the etiology of them prior to that.  A medical screening exam was performed and I feel the patient has had an appropriate workup for their chief complaint at this time and likelihood of emergent condition existing is low. They have been counseled on decision, discharge, follow up and which symptoms necessitate immediate return to the emergency department. They or their family verbally stated understanding and agreement with plan and discharged in stable condition.   ____________________________________________  FINAL CLINICAL IMPRESSION(S) / ED DIAGNOSES  Final diagnoses:  Nonspecific chest pain  Palpitations    MEDICATIONS GIVEN  DURING THIS VISIT:  Medications - No data to display  NEW OUTPATIENT MEDICATIONS STARTED DURING THIS VISIT:  Discharge Medication List as of 11/01/2019  5:31 AM      Note:  This note was prepared with assistance of Dragon voice recognition software. Occasional wrong-word or sound-a-like substitutions may have occurred due to the inherent limitations of voice recognition software.   Jaking Thayer, Barbara Cower, MD 11/01/19 [phone]

## 2019-11-01 NOTE — ED Triage Notes (Signed)
Pt states he has hx of a pneumothorax on the left side  Pt states for the past 2 days he has been having pressure in his right lung  Denies shortness of breath

## 2019-11-08 ENCOUNTER — Other Ambulatory Visit: Payer: Self-pay

## 2019-11-08 ENCOUNTER — Inpatient Hospital Stay (HOSPITAL_BASED_OUTPATIENT_CLINIC_OR_DEPARTMENT_OTHER): Payer: 59 | Admitting: Oncology

## 2019-11-08 VITALS — BP 118/81 | HR 90 | Temp 97.6°F | Resp 18 | Ht 68.0 in | Wt 214.2 lb

## 2019-11-08 DIAGNOSIS — I2699 Other pulmonary embolism without acute cor pulmonale: Secondary | ICD-10-CM | POA: Diagnosis not present

## 2019-11-08 DIAGNOSIS — Z86718 Personal history of other venous thrombosis and embolism: Secondary | ICD-10-CM | POA: Diagnosis not present

## 2019-11-08 NOTE — Progress Notes (Signed)
Hematology and Oncology Follow Up Visit  Billy Galloway [id number] 1985-04-29 33 y.o. 11/08/2019 10:56 AM Patient, No Pcp PerFleming, Shea Stakes, NP   Principle Diagnosis: 34 year old man with pulmonary embolism diagnosed in November 2018.  No clear provoking factors other than prolonged car rides leading up to that event.   Prior Therapy: Warfarin full dose anticoagulation.  Therapy discontinued recently.  Current therapy:   Active surveillance.  Interim History: Billy Galloway returns today for a follow-up visit.  He is a gentleman I saw in consultation in 2018 for diagnosis of a PE.  He has been started on warfarin and recommended at that time 6 months of anticoagulation and reevaluation but did not follow-up since that time.  Since his last visit, he reestablished care with his primary care provider locally and was instructed to discontinue warfarin for the time being.     Medications: I have reviewed the patient's current medications.  Current Outpatient Medications  Medication Sig Dispense Refill  . ALPRAZolam (XANAX) 0.5 MG tablet Take 1 tablet by mouth daily as needed.    Marland Kitchen amphetamine-dextroamphetamine (ADDERALL) 20 MG tablet Take 1 tablet by mouth daily.  0  . warfarin (COUMADIN) 5 MG tablet Take 0.5 tablets (2.5 mg total) by mouth daily. 30 tablet 1   No current facility-administered medications for this visit.     Allergies: No Known Allergies    Physical Exam:  ECOG:    General appearance: Comfortable appearing without any discomfort Head: Normocephalic without any trauma Oropharynx: Mucous membranes are moist and pink without any thrush or ulcers. Eyes: Pupils are equal and round reactive to light. Lymph nodes: No cervical, supraclavicular, inguinal or axillary lymphadenopathy.   Heart:regular rate and rhythm.  S1 and S2 without leg edema. Lung: Clear without any rhonchi or wheezes.  No dullness to percussion. Abdomin: Soft, nontender, nondistended with good  bowel sounds.  No hepatosplenomegaly. Musculoskeletal: No joint deformity or effusion.  Full range of motion noted. Neurological: No deficits noted on motor, sensory and deep tendon reflex exam. Skin: No petechial rash or dryness.  Appeared moist.      Lab Results: Lab Results  Component Value Date   WBC 7.8 11/01/2019   HGB 14.3 11/01/2019   HCT 43.3 11/01/2019   MCV 88.7 11/01/2019   PLT 234 11/01/2019     Chemistry      Component Value Date/Time   NA 138 11/01/2019 0213   K 3.5 11/01/2019 0213   CL 103 11/01/2019 0213   CO2 26 11/01/2019 0213   BUN 19 11/01/2019 0213   CREATININE 0.95 11/01/2019 0213   CREATININE 0.93 05/31/2014 1638      Component Value Date/Time   CALCIUM 9.2 11/01/2019 0213   ALKPHOS 83 05/31/2014 1638   AST 21 05/31/2014 1638   ALT 27 05/31/2014 1638   BILITOT 0.4 05/31/2014 1638        Impression and Plan:   34 year old man with:  1.  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  He was found to have a PE after no clear provoking symptoms although he did have prolonged car ride leading up to that episode.  Hypercoagulable panel obtained on October 11, 2019 was personally reviewed and showed no clear-cut inherited or acquired thrombophilia.  Risks and benefits of continuing lifetime anticoagulation versus discontinuation were debated at this time.  With the fact that he has a male with questionable provoking factors, lifetime anticoagulation argument could be made.  We will also make the argument that potentially  his thrombosis was provoked by his long car ride in 6 months of anticoagulation is sufficient.  Risks and benefits of both approaches were reviewed today.  The complications associated with long-term anticoagulation somebody this young lifetime especially on warfarin or DOAC including increased risk of bleeding such as GI bleeding, intracranial bleeding among others.  Alternative option would be to stop anticoagulation and mitigate  any risk factors associated with increased immobilization.  After discussion today, he is comfortable with stopping anticoagulation but also understands if you develop any future blood clots he will require lifetime anticoagulation.  2.  Anxiety and lifestyle changes: This was discussed today in detail which would help with and only his mental health but his physical health and prevention of future blood clots.  Weight loss as well as regular exercise should help mitigate his risk of developing future blood clots as well as improve his coping mechanism and decreasing anxiety.  3.  Follow-up: Will be as needed in the future.   30  minutes were dedicated to this visit. The time was spent on reviewing laboratory data, discussing treatment options, and answering questions regarding future plan.   Billy Hose, MD 7/29/202110:56 AM

## 2019-11-12 ENCOUNTER — Encounter: Payer: Self-pay | Admitting: General Practice

## 2020-05-10 ENCOUNTER — Emergency Department: Payer: 59

## 2020-05-10 ENCOUNTER — Emergency Department (HOSPITAL_BASED_OUTPATIENT_CLINIC_OR_DEPARTMENT_OTHER)
Admission: EM | Admit: 2020-05-10 | Discharge: 2020-05-10 | Disposition: A | Discharge status: Home / Self Care | Payer: 59 | Attending: Emergency Medicine | Admitting: Emergency Medicine

## 2020-05-10 ENCOUNTER — Other Ambulatory Visit: Payer: Self-pay

## 2020-05-10 ENCOUNTER — Emergency Department (HOSPITAL_BASED_OUTPATIENT_CLINIC_OR_DEPARTMENT_OTHER): Payer: 59

## 2020-05-10 ENCOUNTER — Encounter (HOSPITAL_BASED_OUTPATIENT_CLINIC_OR_DEPARTMENT_OTHER): Payer: Self-pay | Admitting: Emergency Medicine

## 2020-05-10 DIAGNOSIS — R0789 Other chest pain: Secondary | ICD-10-CM

## 2020-05-10 DIAGNOSIS — Z7901 Long term (current) use of anticoagulants: Secondary | ICD-10-CM | POA: Diagnosis not present

## 2020-05-10 DIAGNOSIS — R000 Tachycardia, unspecified: Secondary | ICD-10-CM | POA: Diagnosis not present

## 2020-05-10 LAB — BASIC METABOLIC PANEL
Anion gap: 10 (ref 5–15)
BUN: 19 mg/dL (ref 6–20)
CO2: 24 mmol/L (ref 22–32)
Calcium: 8.8 mg/dL — ABNORMAL LOW (ref 8.9–10.3)
Chloride: 102 mmol/L (ref 98–111)
Creatinine, Ser: 0.92 mg/dL (ref 0.61–1.24)
GFR, Estimated: >60 mL/min (ref >60)
Glucose, Bld: 116 mg/dL — ABNORMAL HIGH (ref 70–99)
Potassium: 3.8 mmol/L (ref 3.5–5.1)
Sodium: 136 mmol/L (ref 135–145)

## 2020-05-10 LAB — D-DIMER, QUANTITATIVE: D-Dimer, Quant: 0.34 ug/mL-FEU (ref 0.00–0.50)

## 2020-05-10 LAB — CBC WITH DIFFERENTIAL/PLATELET
Abs Immature Granulocytes: 0.05 10*3/uL (ref 0.00–0.07)
Basophils Absolute: 0.1 10*3/uL (ref 0.0–0.1)
Basophils Relative: 1 %
Eosinophils Absolute: 0.2 10*3/uL (ref 0.0–0.5)
Eosinophils Relative: 1 %
HCT: 44 % (ref 39.0–52.0)
Hemoglobin: 15.2 g/dL (ref 13.0–17.0)
Immature Granulocytes: 1 %
Lymphocytes Relative: 17 %
Lymphs Abs: 1.8 10*3/uL (ref 0.7–4.0)
MCH: 30.1 pg (ref 26.0–34.0)
MCHC: 34.5 g/dL (ref 30.0–36.0)
MCV: 87.1 fL (ref 80.0–100.0)
Monocytes Absolute: 1.2 10*3/uL — ABNORMAL HIGH (ref 0.1–1.0)
Monocytes Relative: 12 %
Neutro Abs: 7.2 10*3/uL (ref 1.7–7.7)
Neutrophils Relative %: 68 %
Platelets: 258 10*3/uL (ref 150–400)
RBC: 5.05 MIL/uL (ref 4.22–5.81)
RDW: 12.4 % (ref 11.5–15.5)
WBC: 10.4 10*3/uL (ref 4.0–10.5)
nRBC: 0 % (ref 0.0–0.2)

## 2020-05-10 LAB — BRAIN NATRIURETIC PEPTIDE: B Natriuretic Peptide: 27.4 pg/mL (ref 0.0–100.0)

## 2020-05-10 MED ORDER — SODIUM CHLORIDE 0.9 % IV BOLUS
500.0000 mL | Freq: Once | INTRAVENOUS | Status: AC
  Administered 2020-05-10: 500 mL via INTRAVENOUS

## 2020-05-10 NOTE — ED Provider Notes (Signed)
[facility] EMERGENCY DEPARTMENT Provider Note   CSN: [id number] Arrival date & time: 05/10/20  1550     History Chief Complaint  Patient presents with  . Chest Pain    Billy Galloway is a 35 y.o. male.  36 year old male with history of PE (thought due be due to extended car ride, not currently anticoagulated after hematology work up), pneumothorax, anxiety, presents with complaint of chest pressure and elevated heart rate. Patient reports recent COVID illness with sore throat, congestion, occasionally productive cough (mucous, non bloody). Patient reports having lower mid chest pressure with taking deep breaths for the past few days, today while sitting at the table his watch alerted him to a high heart rate of 130 for 10 minutes, patient then began to feel dizzy. Denies lower extremity edema, DOE, SHOB at rest, orthopnea. No other complaints or concerns.         Past Medical History:  Diagnosis Date  . ADD (attention deficit disorder)   . Pneumonia   . Pneumothorax   . Pulmonary embolism (HCC)   . Seizures (HCC)    as a baby    Patient Active Problem List   Diagnosis Date Noted  . Seizures (HCC) 04/06/2017  . Pulmonary embolism (HCC) 03/14/2017  . Annual physical exam 09/03/2014  . ADD (attention deficit disorder) 05/31/2014  . Anxiety 05/31/2014    Past Surgical History:  Procedure Laterality Date  . CHEST TUBE INSERTION  2020  . NO PAST SURGERIES         Family History  Problem Relation Age of Onset  . Alcohol abuse Father   . Hypertension Father   . Diabetes Father   . Alcohol abuse Paternal Grandfather   . Hypertension Paternal Grandfather   . Diabetes Paternal Grandfather   . CAD Other        GM?  Marland Kitchen Colon cancer Neg Hx   . Prostate cancer Neg Hx   . Colon polyps Neg Hx   . Esophageal cancer Neg Hx   . Kidney disease Neg Hx   . Gallbladder disease Neg Hx     Social History   Tobacco Use  . Smoking status: Never Smoker  .  Smokeless tobacco: Never Used  Vaping Use  . Vaping Use: Never used  Substance Use Topics  . Alcohol use: No    Alcohol/week: 0.0 standard drinks    Comment: rare   . Drug use: No    Home Medications Prior to Admission medications   Medication Sig Start Date End Date Taking? Authorizing Provider  ALPRAZolam Prudy Feeler) 0.5 MG tablet Take 1 tablet by mouth daily as needed. 06/06/14   [provider]  amphetamine-dextroamphetamine (ADDERALL) 20 MG tablet Take 1 tablet by mouth daily. 05/23/14   [provider]  warfarin (COUMADIN) 5 MG tablet Take 0.5 tablets (2.5 mg total) by mouth daily. 06/27/17   Quentin Angst, MD    Allergies    Patient has no known allergies.  Review of Systems   Review of Systems  Constitutional: Negative for chills, diaphoresis and fever.  HENT: Negative for sore throat.   Respiratory: Negative for cough, shortness of breath and wheezing.   Cardiovascular: Positive for chest pain. Negative for palpitations and leg swelling.  Gastrointestinal: Negative for nausea and vomiting.  Musculoskeletal: Negative for back pain.  Skin: Negative for rash and wound.  Allergic/Immunologic: Negative for immunocompromised state.  Neurological: Positive for dizziness.  Hematological: Negative for adenopathy. Does not bruise/bleed easily.  All other systems reviewed and are negative.   Physical Exam Updated Vital Signs BP 129/77   Pulse 83   Temp 98 F (36.7 C) (Oral)   Resp 20   Ht 5\' 8"  (1.727 m)   Wt 102.1 kg   SpO2 98%   BMI 34.21 kg/m   Physical Exam Vitals and nursing note reviewed.  Constitutional:      General: He is not in acute distress.    Appearance: He is well-developed and well-nourished. He is obese. He is not diaphoretic.  HENT:     Head: Normocephalic and atraumatic.  Cardiovascular:     Rate and Rhythm: Regular rhythm. Tachycardia present.     Heart sounds: Normal heart sounds. No murmur heard.   Pulmonary:      Effort: Pulmonary effort is normal.     Breath sounds: Normal breath sounds. No decreased breath sounds, wheezing, rhonchi or rales.  Chest:     Chest wall: No tenderness.  Abdominal:     Palpations: Abdomen is soft.     Tenderness: There is no abdominal tenderness.  Musculoskeletal:     Right lower leg: No tenderness. No edema.     Left lower leg: No tenderness. No edema.  Skin:    General: Skin is warm and dry.     Findings: No erythema or rash.  Neurological:     Mental Status: He is alert and oriented to person, place, and time.  Psychiatric:        Mood and Affect: Mood and affect normal.        Behavior: Behavior normal.     ED Results / Procedures / Treatments   Labs (all labs ordered are listed, but only abnormal results are displayed) Labs Reviewed  BASIC METABOLIC PANEL - Abnormal; Notable for the following components:      Result Value   Glucose, Bld 116 (*)    Calcium 8.8 (*)    All other components within normal limits  CBC WITH DIFFERENTIAL/PLATELET - Abnormal; Notable for the following components:   Monocytes Absolute 1.2 (*)    All other components within normal limits  BRAIN NATRIURETIC PEPTIDE  D-DIMER, QUANTITATIVE (NOT AT [facility])    EKG EKG Interpretation  Date/Time:  Saturday May 10 2020 16:02:27 EST Ventricular Rate:  103 PR Interval:    QRS Duration: 90 QT Interval:  324 QTC Calculation: 425 R Axis:   80 Text Interpretation: Sinus tachycardia SINCE LAST TRACING HEART RATE HAS INCREASED Confirmed by [birth year] [phone]) on 05/10/2020 6:24:04 PM   Radiology DG Chest 2 View  Result Date: 05/10/2020 CLINICAL DATA:  Chest pressure since last night and tachycardia since this afternoon. Positive COVID-19 test 2.5 weeks ago. EXAM: CHEST - 2 VIEW COMPARISON:  None. FINDINGS: The heart size and mediastinal contours are within normal limits. Both lungs are clear. The visualized skeletal structures are unremarkable. IMPRESSION: Normal examination.  Electronically Signed   By: 05/12/2020 M.D.   On: 05/10/2020 16:51    Procedures Procedures   Medications Ordered in ED Medications  sodium chloride 0.9 % bolus 500 mL (0 mLs Intravenous Stopped 05/10/20 1810)    ED Course  I have reviewed the triage vital signs and the nursing notes.  Pertinent labs & imaging results that were available during my care of the patient were reviewed by me and considered in my medical decision making (see chart for details).  Clinical Course as of 05/10/20 1846  Sat May 10, 2020  1843 34  year old male with history as above, on exam, lungs CTA, no chest wall tenderness, no lower extremity edema or calf tenderness. EKG with sinus tachycardia, rate improved with IV fluids to 70s. CBC and BMP without significant findings. BNP normal at 27.4, d-dimer negative. CXR unremarkable. Discussed results with patient, discussed possible dehydration as to cause for elevated HR today which has now improved.  Plan is home to light activity, hydrate, follow up with PCP, return for worsening or concerning symptoms.  [LM]    Clinical Course User Index [LM] Alden Hipp   MDM Rules/Calculators/A&P                          Final Clinical Impression(s) / ED Diagnoses Final diagnoses:  Atypical chest pain  Tachycardia    Rx / DC Orders ED Discharge Orders    None       Jeannie Fend, PA-C 05/10/20 1846    Rolan Bucco, MD 05/10/20 1906

## 2020-05-10 NOTE — ED Triage Notes (Signed)
Pt arrives with c/o chest pressure that started last night, and tachycardia starting this afternoon. Pt endorses recent Covid x 2.5 weeks pta.Billy Galloway

## 2020-05-10 NOTE — Discharge Instructions (Signed)
Home to light activity. Deep breathing exercises as discussed. Hydrating fluids such as low sugar gatorade.  Recheck with your doctor, return to the ER for worsening or concerning symptoms.

## 2021-09-10 IMAGING — CR DG CHEST 2V
2 series · 2 of 2 positions shown · non-contrast
Comparison: None.

CLINICAL DATA: Chest pain.

EXAM:
CHEST - 2 VIEW

[chest pa]
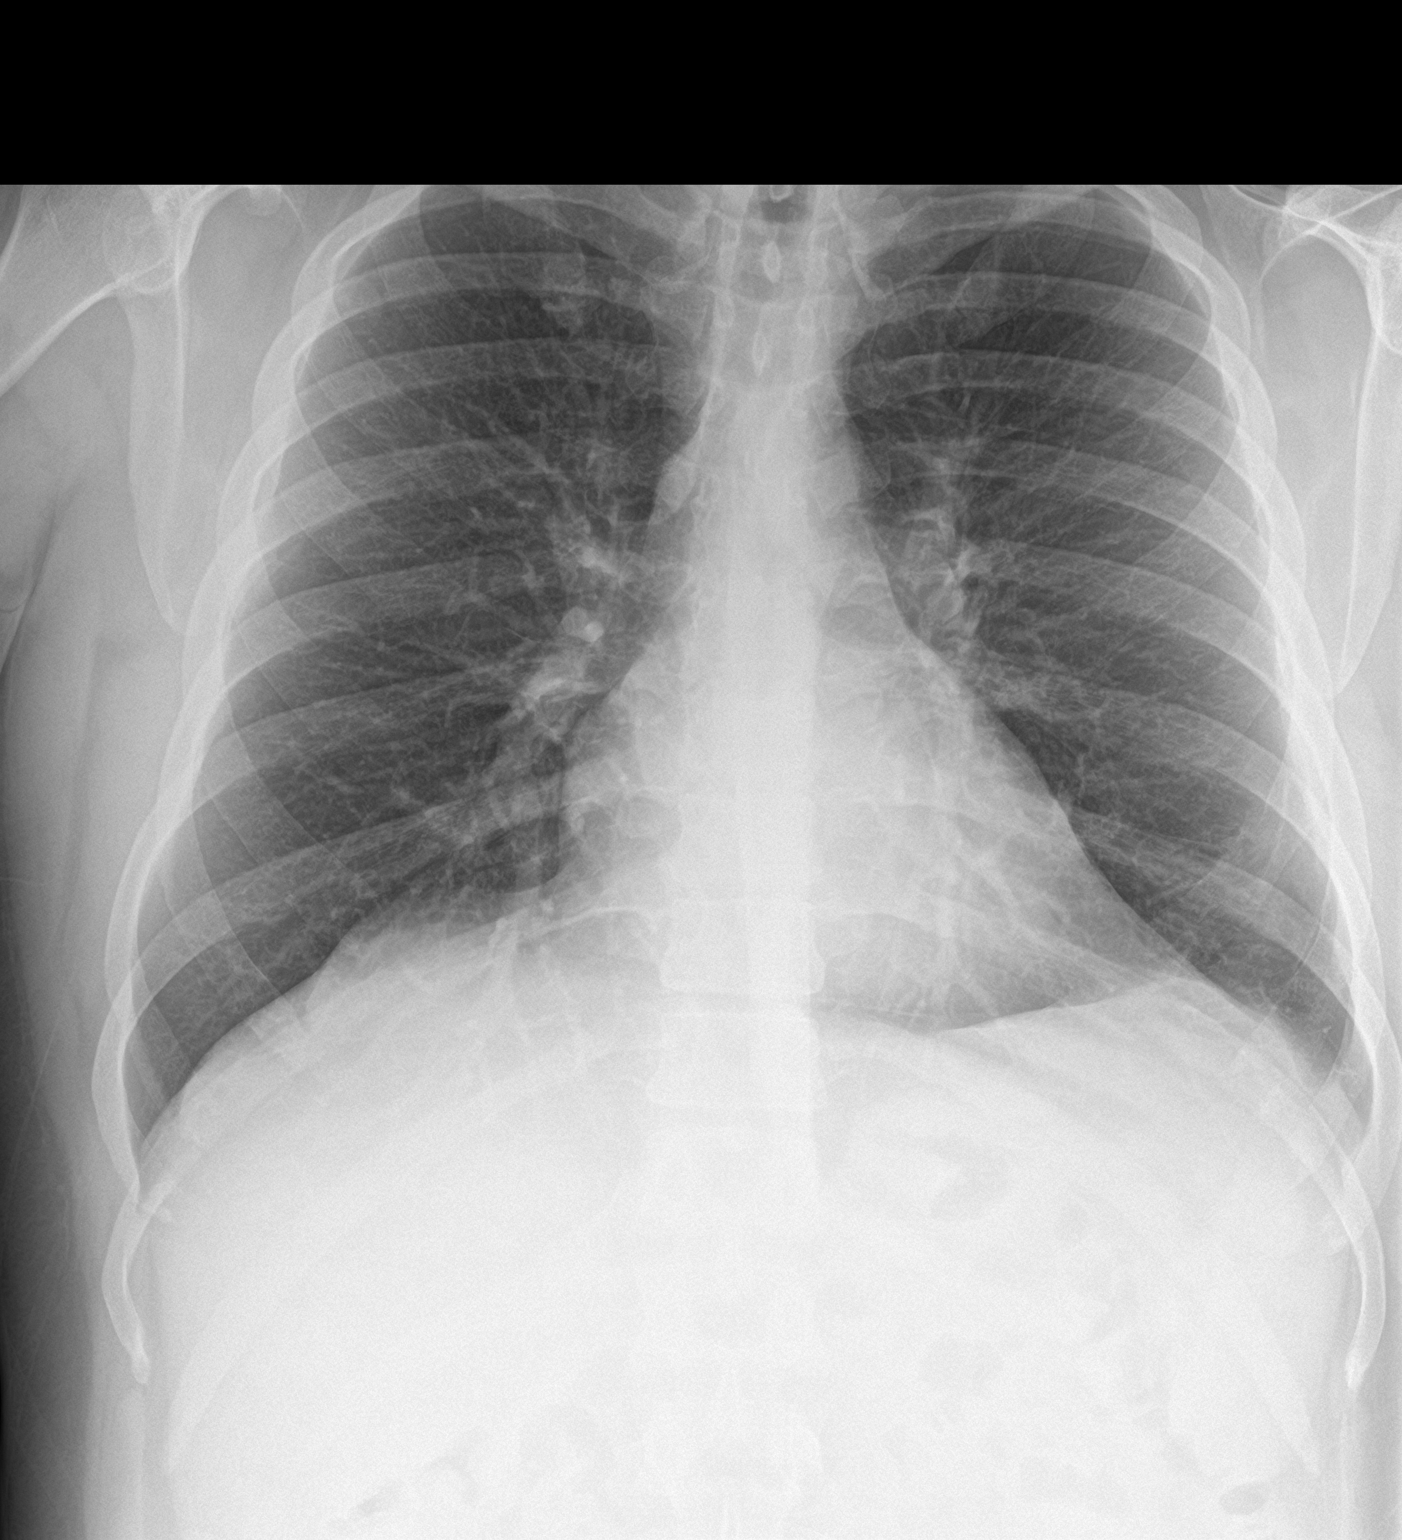

[chest lat]
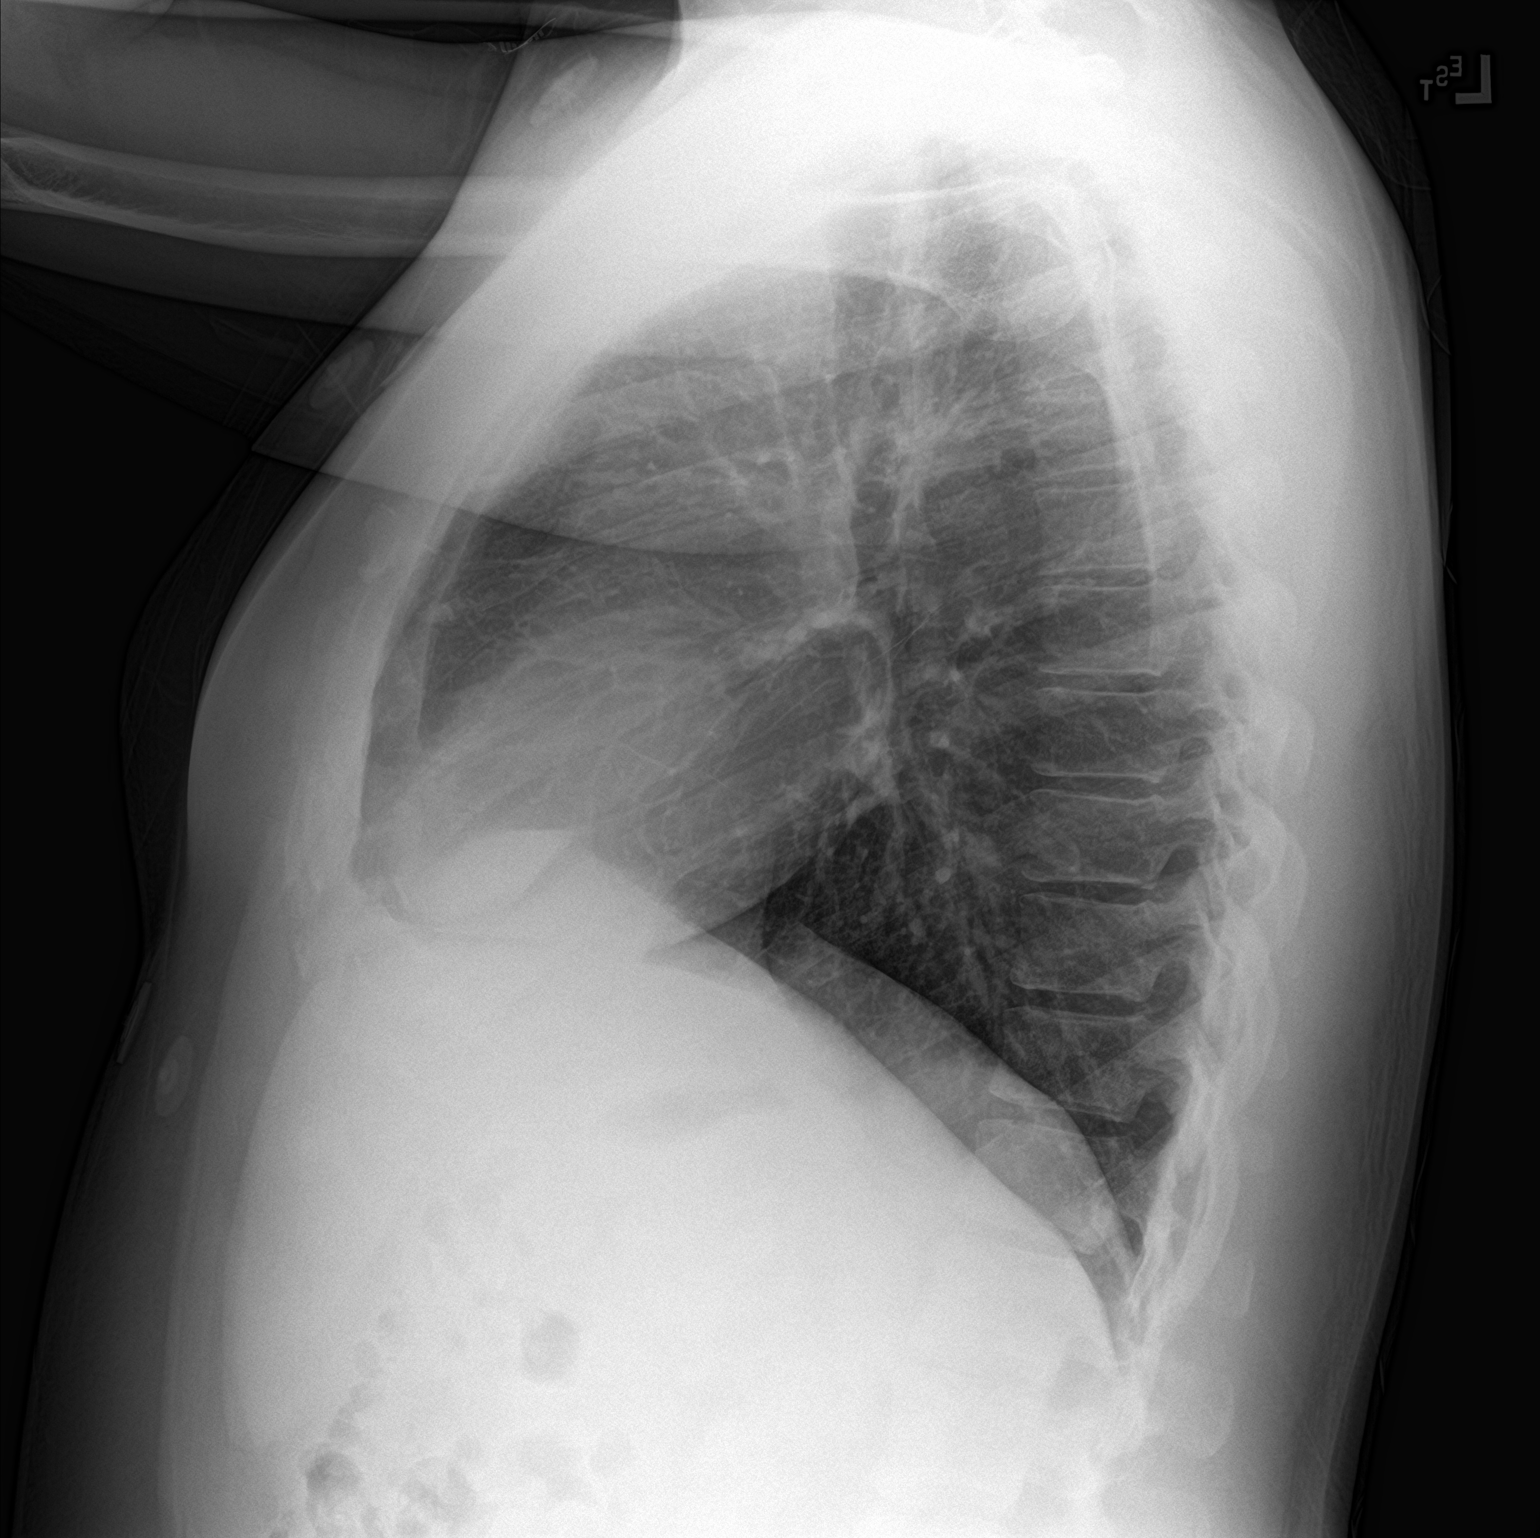

[2 of 2 positions shown; findings below may reference images not displayed]

FINDINGS: The heart size and mediastinal contours are within normal limits.
Both lungs are clear. The visualized skeletal structures are
unremarkable.
IMPRESSION: No active cardiopulmonary disease.

## 2022-03-20 IMAGING — CR DG CHEST 2V
2 series · 2 of 2 positions shown · non-contrast
Comparison: None.

CLINICAL DATA: Chest pressure since last night and tachycardia
since this afternoon. Positive [id number] test 2.5 weeks ago.

EXAM:
CHEST - 2 VIEW

[w chest pa]
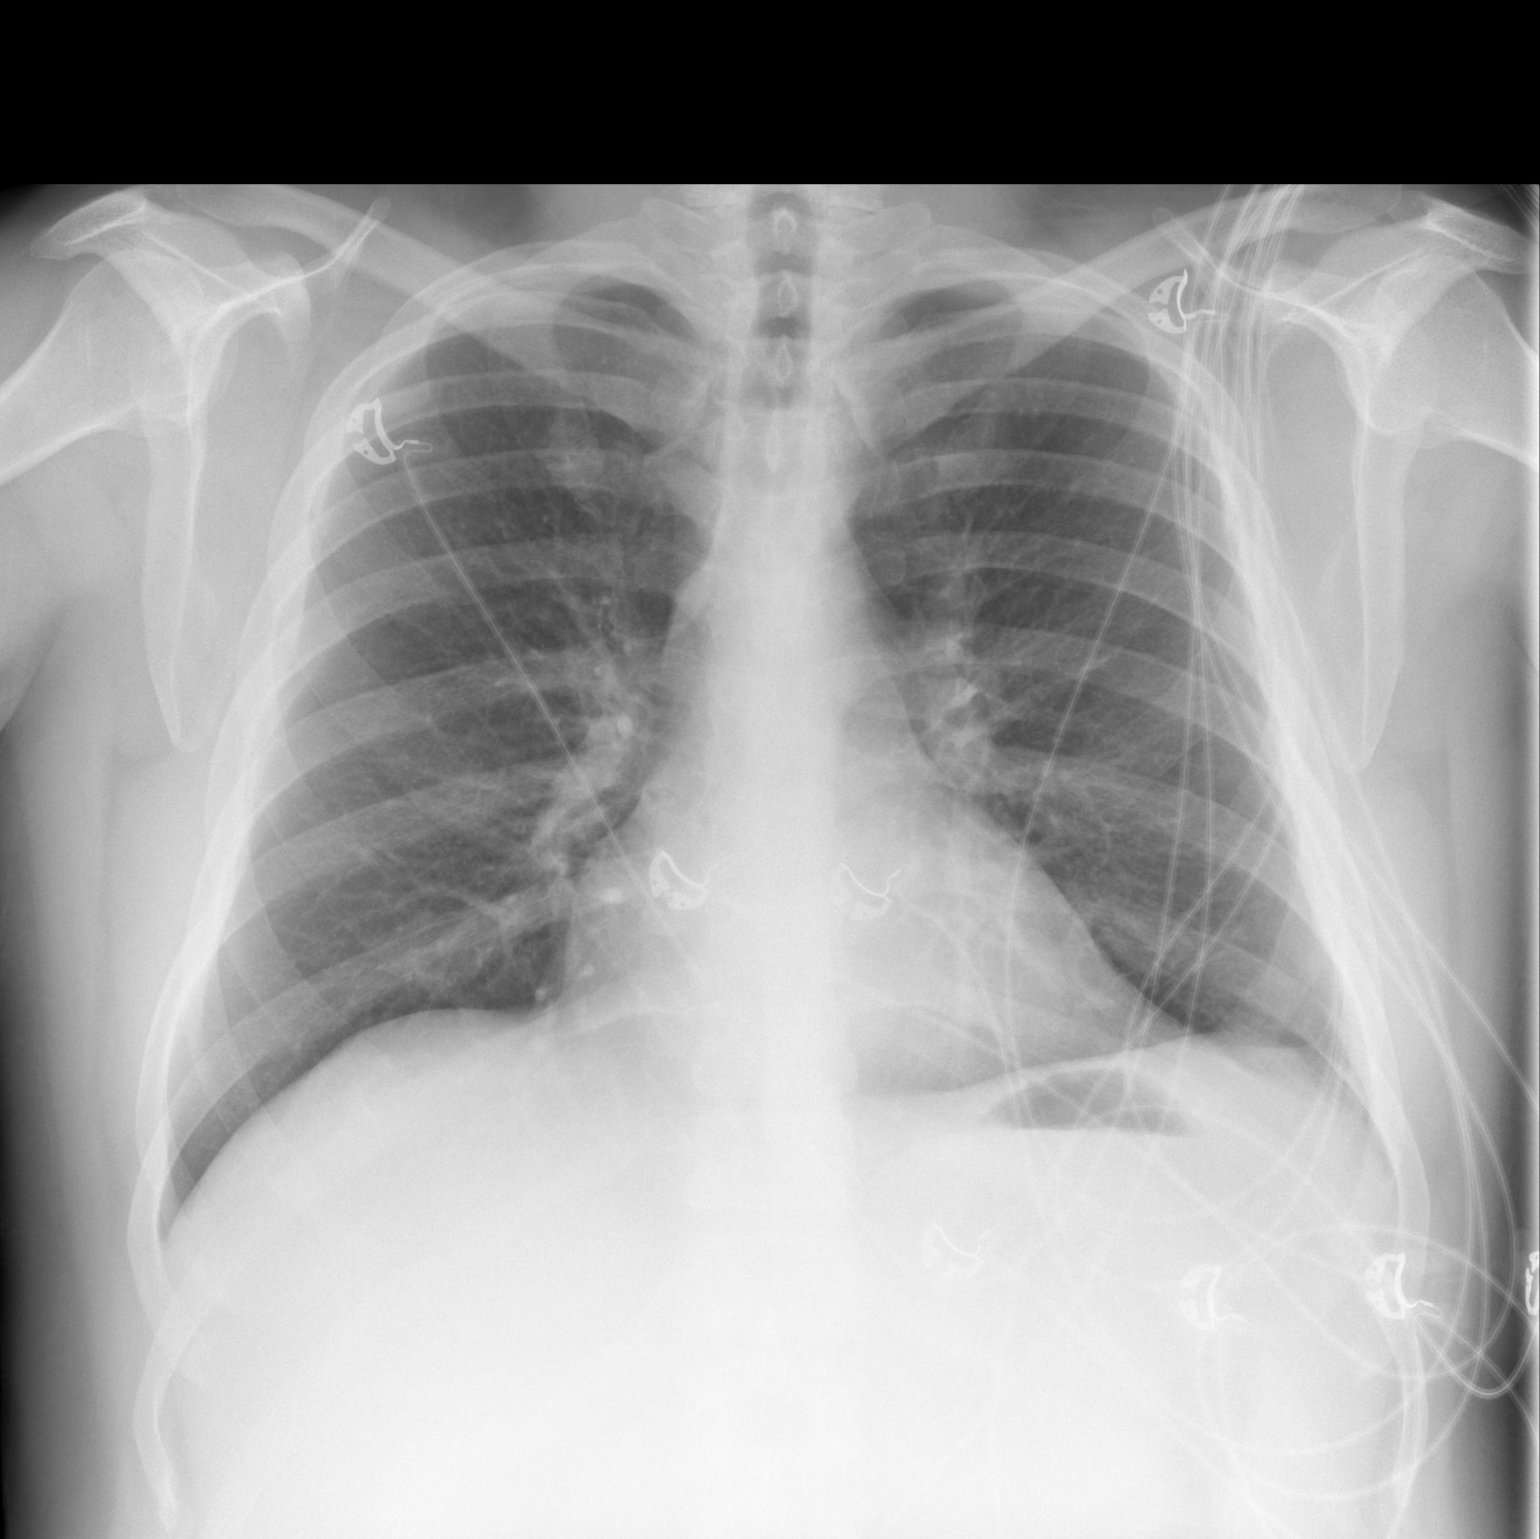

[w chest lat]
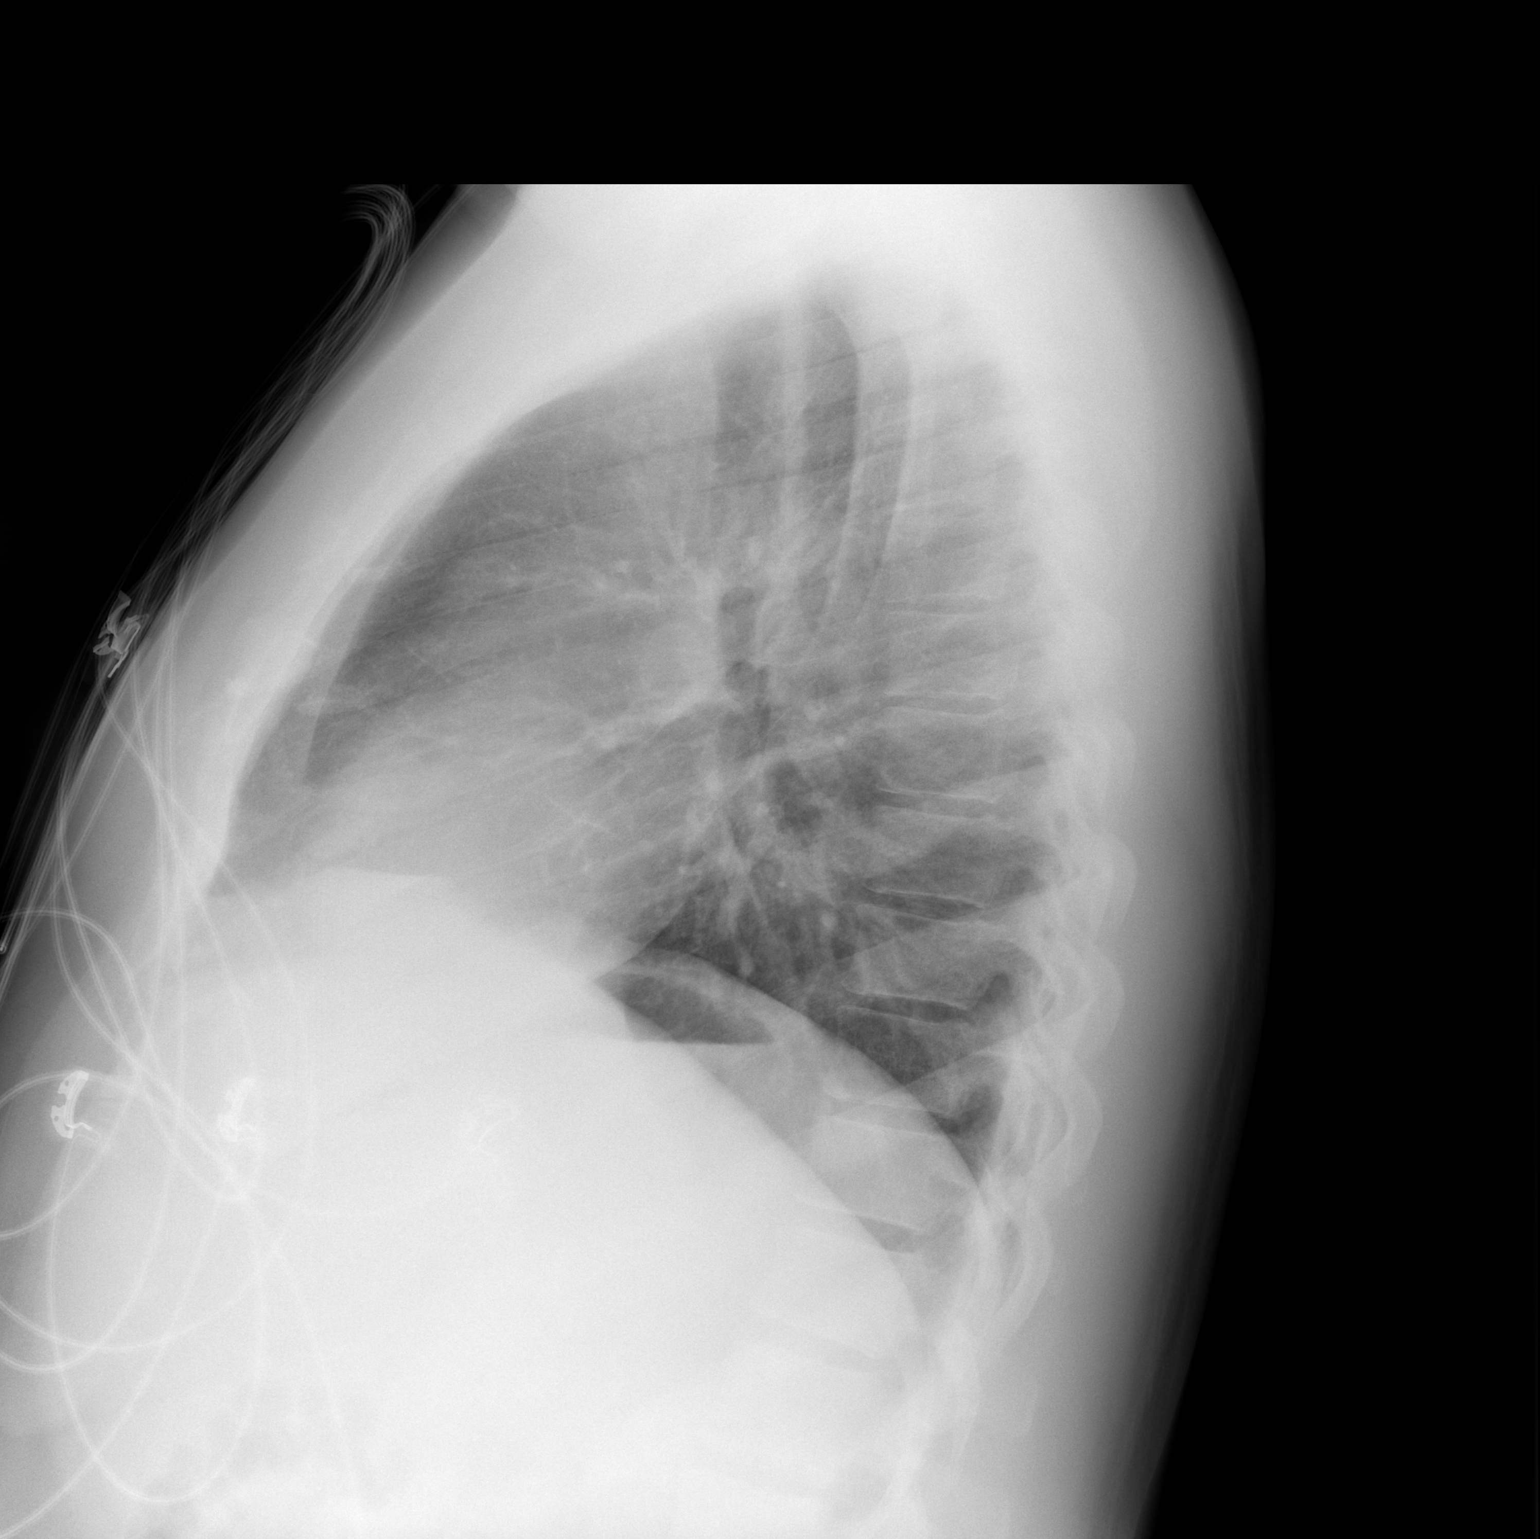

[2 of 2 positions shown; findings below may reference images not displayed]

FINDINGS: The heart size and mediastinal contours are within normal limits.
Both lungs are clear. The visualized skeletal structures are
unremarkable.
IMPRESSION: Normal examination.
